# Patient Record
Sex: Male | Born: 1980 | Race: White | Hispanic: No | Marital: Married | Smoking: Current some day smoker
Health system: Southern US, Community
[De-identification: ages and names within clinical notes are randomized; demographics above are authoritative.]

## PROBLEM LIST (undated history)

## (undated) DIAGNOSIS — E669 Obesity, unspecified: Secondary | ICD-10-CM

## (undated) DIAGNOSIS — E785 Hyperlipidemia, unspecified: Secondary | ICD-10-CM

## (undated) DIAGNOSIS — R002 Palpitations: Secondary | ICD-10-CM

---

## 2006-02-01 ENCOUNTER — Emergency Department (HOSPITAL_COMMUNITY): Admission: EM | Admit: 2006-02-01 | Discharge: 2006-02-01 | Payer: Self-pay | Admitting: Emergency Medicine

## 2015-04-19 ENCOUNTER — Emergency Department
Admission: EM | Admit: 2015-04-19 | Discharge: 2015-04-19 | Disposition: A | Discharge status: Home / Self Care | Payer: BLUE CROSS/BLUE SHIELD | Attending: Emergency Medicine | Admitting: Emergency Medicine

## 2015-04-19 ENCOUNTER — Emergency Department: Payer: BLUE CROSS/BLUE SHIELD

## 2015-04-19 DIAGNOSIS — N201 Calculus of ureter: Secondary | ICD-10-CM | POA: Diagnosis not present

## 2015-04-19 DIAGNOSIS — R109 Unspecified abdominal pain: Secondary | ICD-10-CM | POA: Diagnosis present

## 2015-04-19 LAB — BASIC METABOLIC PANEL
ANION GAP: 8 (ref 5–15)
BUN: 14 mg/dL (ref 6–20)
CALCIUM: 8.9 mg/dL (ref 8.9–10.3)
CO2: 26 mmol/L (ref 22–32)
Chloride: 102 mmol/L (ref 101–111)
Creatinine, Ser: 1.08 mg/dL (ref 0.61–1.24)
GFR calc Af Amer: >60 mL/min (ref >60)
Glucose, Bld: 141 mg/dL — ABNORMAL HIGH (ref 65–99)
POTASSIUM: 4.4 mmol/L (ref 3.5–5.1)
SODIUM: 136 mmol/L (ref 135–145)

## 2015-04-19 LAB — URINALYSIS COMPLETE WITH MICROSCOPIC (ARMC ONLY)
BILIRUBIN URINE: NEGATIVE
Bacteria, UA: NONE SEEN
GLUCOSE, UA: NEGATIVE mg/dL
KETONES UR: NEGATIVE mg/dL
Leukocytes, UA: NEGATIVE
Nitrite: NEGATIVE
PH: 5 (ref 5.0–8.0)
Protein, ur: NEGATIVE mg/dL
Specific Gravity, Urine: 1.017 (ref 1.005–1.030)
Squamous Epithelial / LPF: NONE SEEN

## 2015-04-19 LAB — CBC WITH DIFFERENTIAL/PLATELET
BASOS ABS: 0.1 10*3/uL (ref 0–0.1)
Basophils Relative: 1 %
EOS ABS: 0.1 10*3/uL (ref 0–0.7)
EOS PCT: 1 %
HCT: 41.5 % (ref 40.0–52.0)
Hemoglobin: 14.1 g/dL (ref 13.0–18.0)
Lymphocytes Relative: 13 %
Lymphs Abs: 1.3 10*3/uL (ref 1.0–3.6)
MCH: 29.8 pg (ref 26.0–34.0)
MCHC: 34 g/dL (ref 32.0–36.0)
MCV: 87.4 fL (ref 80.0–100.0)
Monocytes Absolute: 0.5 10*3/uL (ref 0.2–1.0)
Monocytes Relative: 5 %
Neutro Abs: 8.2 10*3/uL — ABNORMAL HIGH (ref 1.4–6.5)
Neutrophils Relative %: 80 %
PLATELETS: 205 10*3/uL (ref 150–440)
RBC: 4.75 MIL/uL (ref 4.40–5.90)
RDW: 13.3 % (ref 11.5–14.5)
WBC: 10.1 10*3/uL (ref 3.8–10.6)

## 2015-04-19 MED ORDER — ONDANSETRON HCL 4 MG/2ML IJ SOLN
4.0000 mg | Freq: Once | INTRAMUSCULAR | Status: AC
Start: 2015-04-19 — End: 2015-04-19
  Administered 2015-04-19: 4 mg via INTRAVENOUS
  Filled 2015-04-19: qty 2

## 2015-04-19 MED ORDER — OXYCODONE-ACETAMINOPHEN 5-325 MG PO TABS: 1.0000 | ORAL_TABLET | Freq: Four times a day (QID) | ORAL | Status: DC | PRN

## 2015-04-19 MED ORDER — SODIUM CHLORIDE 0.9 % IV BOLUS (SEPSIS)
1000.0000 mL | Freq: Once | INTRAVENOUS | Status: AC
  Administered 2015-04-19: 1000 mL via INTRAVENOUS

## 2015-04-19 MED ORDER — TAMSULOSIN HCL 0.4 MG PO CAPS: 0.4000 mg | ORAL_CAPSULE | Freq: Every day | ORAL | Status: DC

## 2015-04-19 MED ORDER — MORPHINE SULFATE (PF) 4 MG/ML IV SOLN
4.0000 mg | Freq: Once | INTRAVENOUS | Status: AC
  Administered 2015-04-19: 4 mg via INTRAVENOUS
  Filled 2015-04-19: qty 1

## 2015-04-19 MED ORDER — KETOROLAC TROMETHAMINE 30 MG/ML IJ SOLN
30.0000 mg | Freq: Once | INTRAMUSCULAR | Status: AC
  Administered 2015-04-19: 30 mg via INTRAVENOUS
  Filled 2015-04-19: qty 1

## 2015-04-19 NOTE — ED Provider Notes (Signed)
[facility] Emergency Department Provider Note  Time seen: 10:49 AM  I have reviewed the triage vital signs and the nursing notes.   HISTORY  Chief Complaint Flank Pain    HPI Kevin Clark is a 35 y.o. male with no past medical history who awoke this morning at 7:30 AM with severe sharp right flank pain. States he had one kidney stone 9 years ago which this feels very similar to but feels worse. Denies any dysuria or hematuria. Does state nausea but denies vomiting or diarrhea. Denies fever. Describes his pain as sharp/stabbing, 10/10 and located in the right back to right flank.     No past medical history on file.  There are no active problems to display for this patient.   No past surgical history on file.  No current outpatient prescriptions on file.  Allergies Review of patient's allergies indicates no known allergies.  No family history on file.  Social History Social History  Substance Use Topics  . Smoking status: Not on file  . Smokeless tobacco: Not on file  . Alcohol Use: Not on file    Review of Systems Constitutional: Negative for fever. Cardiovascular: Negative for chest pain. Respiratory: Negative for shortness of breath. Gastrointestinal: Positive right flank pain. Positive nausea. Negative vomiting or diarrhea. Genitourinary: Negative for dysuria. Negative hematuria. Neurological: Negative for headache 10-point ROS otherwise negative.  ____________________________________________   PHYSICAL EXAM:  VITAL SIGNS: ED Triage Vitals  Enc Vitals Group     BP 04/19/15 1009 147/90 mmHg     Pulse Rate 04/19/15 1009 63     Resp 04/19/15 1009 18     Temp 04/19/15 1009 97.9 F (36.6 C)     Temp Source 04/19/15 1009 Oral     SpO2 04/19/15 1009 100 %     Weight 04/19/15 1009 220 lb (99.791 kg)     Height 04/19/15 1009  (1.651 m)     Head Cir --      Peak Flow --      Pain Score 04/19/15 1011 6     Pain Loc --    Pain Edu? --      Excl. in GC? --     Constitutional: Alert and oriented. Well appearing and in no distress. Eyes: Normal exam ENT   Head: Normocephalic and atraumatic   Mouth/Throat: Mucous membranes are moist. Cardiovascular: Normal rate, regular rhythm. No murmur Respiratory: Normal respiratory effort without tachypnea nor retractions. Breath sounds are clear  Gastrointestinal: Soft and nontender. No distention.  No CVA tenderness. Musculoskeletal: Nontender with normal range of motion in all extremities.  Neurologic:  Normal speech and language. No gross focal neurologic deficits Skin:  Skin is warm, dry and intact.  Psychiatric: Mood and affect are normal. Speech and behavior are normal.   ____________________________________________   INITIAL IMPRESSION / ASSESSMENT AND PLAN / ED COURSE  Pertinent labs & imaging results that were available during my care of the patient were reviewed by me and considered in my medical decision making (see chart for details).  Patient presents the emergency department with right flank pain beginning approximately 3 hours ago. Describes a sharp/stabbing similar to his previous kidney stone. Prior kidney stone 9 years ago passed on its own without intervention. We will treat the patient's pain and nausea, IV hydrate, and obtain a CT scan.  CT consistent with 3 mm right ureteral stone. We'll place the patient on Flomax, Percocet, have him follow up with Dr. Apolinar Junes. Patient  states his pain is a 0/10 currently.  ____________________________________________   FINAL CLINICAL IMPRESSION(S) / ED DIAGNOSES  Right ureterolithiasis   Kevin Antis, MD 04/19/15 1228

## 2015-04-19 NOTE — ED Notes (Signed)
Pt reports right sided flank pain starting this am. Pt has hx of renal stone but reports last one 9 years ago

## 2015-04-19 NOTE — Discharge Instructions (Signed)
Kidney Stones °Kidney stones (urolithiasis) are deposits that form inside your kidneys. The intense pain is caused by the stone moving through the urinary tract. When the stone moves, the ureter goes into spasm around the stone. The stone is usually passed in the urine.  °CAUSES  °· A disorder that makes certain neck glands produce too much parathyroid hormone (primary hyperparathyroidism). °· A buildup of uric acid crystals, similar to gout in your joints. °· Narrowing (stricture) of the ureter. °· A kidney obstruction present at birth (congenital obstruction). °· Previous surgery on the kidney or ureters. °· Numerous kidney infections. °SYMPTOMS  °· Feeling sick to your stomach (nauseous). °· Throwing up (vomiting). °· Blood in the urine (hematuria). °· Pain that usually spreads (radiates) to the groin. °· Frequency or urgency of urination. °DIAGNOSIS  °· Taking a history and physical exam. °· Blood or urine tests. °· CT scan. °· Occasionally, an examination of the inside of the urinary bladder (cystoscopy) is performed. °TREATMENT  °· Observation. °· Increasing your fluid intake. °· Extracorporeal shock wave lithotripsy--This is a noninvasive procedure that uses shock waves to break up kidney stones. °· Surgery may be needed if you have severe pain or persistent obstruction. There are various surgical procedures. Most of the procedures are performed with the use of small instruments. Only small incisions are needed to accommodate these instruments, so recovery time is minimized. °The size, location, and chemical composition are all important variables that will determine the proper choice of action for you. Talk to your health care provider to better understand your situation so that you will minimize the risk of injury to yourself and your kidney.  °HOME CARE INSTRUCTIONS  °· Drink enough water and fluids to keep your urine clear or pale yellow. This will help you to pass the stone or stone fragments. °· Strain  all urine through the provided strainer. Keep all particulate matter and stones for your health care provider to see. The stone causing the pain may be as small as a grain of salt. It is very important to use the strainer each and every time you pass your urine. The collection of your stone will allow your health care provider to analyze it and verify that a stone has actually passed. The stone analysis will often identify what you can do to reduce the incidence of recurrences. °· Only take over-the-counter or prescription medicines for pain, discomfort, or fever as directed by your health care provider. °· Keep all follow-up visits as told by your health care provider. This is important. °· Get follow-up X-rays if required. The absence of pain does not always mean that the stone has passed. It may have only stopped moving. If the urine remains completely obstructed, it can cause loss of kidney function or even complete destruction of the kidney. It is your responsibility to make sure X-rays and follow-ups are completed. Ultrasounds of the kidney can show blockages and the status of the kidney. Ultrasounds are not associated with any radiation and can be performed easily in a matter of minutes. °· Make changes to your daily diet as told by your health care provider. You may be told to: °¨ Limit the amount of salt that you eat. °¨ Eat 5 or more servings of fruits and vegetables each day. °¨ Limit the amount of meat, poultry, fish, and eggs that you eat. °· Collect a 24-hour urine sample as told by your health care provider. You may need to collect another urine sample every 6-12   months. °SEEK MEDICAL CARE IF: °· You experience pain that is progressive and unresponsive to any pain medicine you have been prescribed. °SEEK IMMEDIATE MEDICAL CARE IF:  °· Pain cannot be controlled with the prescribed medicine. °· You have a fever or shaking chills. °· The severity or intensity of pain increases over 18 hours and is not  relieved by pain medicine. °· You develop a new onset of abdominal pain. °· You feel faint or pass out. °· You are unable to urinate. °  °This information is not intended to replace advice given to you by your health care provider. Make sure you discuss any questions you have with your health care provider. °  °Document Released: 02/27/2005 Document Revised: 11/18/2014 Document Reviewed: 07/31/2012 °Elsevier Interactive Patient Education ©2016 [employer] ° °

## 2018-04-24 DIAGNOSIS — H6012 Cellulitis of left external ear: Secondary | ICD-10-CM | POA: Diagnosis not present

## 2018-08-26 DIAGNOSIS — R0609 Other forms of dyspnea: Secondary | ICD-10-CM | POA: Diagnosis not present

## 2018-08-26 DIAGNOSIS — Z0000 Encounter for general adult medical examination without abnormal findings: Secondary | ICD-10-CM | POA: Diagnosis not present

## 2018-08-26 DIAGNOSIS — E782 Mixed hyperlipidemia: Secondary | ICD-10-CM | POA: Diagnosis not present

## 2018-08-26 DIAGNOSIS — L03221 Cellulitis of neck: Secondary | ICD-10-CM | POA: Diagnosis not present

## 2018-08-26 DIAGNOSIS — Z6841 Body Mass Index (BMI) 40.0 and over, adult: Secondary | ICD-10-CM | POA: Diagnosis not present

## 2019-07-03 ENCOUNTER — Ambulatory Visit: Payer: Self-pay | Attending: Internal Medicine

## 2019-07-03 DIAGNOSIS — Z23 Encounter for immunization: Secondary | ICD-10-CM

## 2019-07-03 NOTE — Progress Notes (Signed)
   Covid-19 Vaccination Clinic  Name:  Kevin Clark    MRN: [id number] DOB: 1981-01-27  07/03/2019  [name] was observed post Covid-19 immunization for 15 minutes without incident. He was provided with Vaccine Information Sheet and instruction to access the V-Safe system.   [name] was instructed to call 911 with any severe reactions post vaccine: Marland Kitchen Difficulty breathing  . Swelling of face and throat  . A fast heartbeat  . A bad rash all over body  . Dizziness and weakness   Immunizations Administered    Name Date Dose VIS Date Route   Pfizer COVID-19 Vaccine 07/03/2019  8:30 AM 0.3 mL 05/07/2018 Intramuscular   Manufacturer: [employer], [employer]   Lot: [id number]   NDC: [address]-8

## 2019-07-29 ENCOUNTER — Emergency Department
Admission: EM | Admit: 2019-07-29 | Discharge: 2019-07-29 | Disposition: A | Discharge status: Home / Self Care | Payer: 59 | Attending: Emergency Medicine | Admitting: Emergency Medicine

## 2019-07-29 ENCOUNTER — Ambulatory Visit: Payer: Self-pay

## 2019-07-29 ENCOUNTER — Encounter: Payer: Self-pay | Admitting: Emergency Medicine

## 2019-07-29 ENCOUNTER — Other Ambulatory Visit: Payer: Self-pay

## 2019-07-29 ENCOUNTER — Emergency Department: Payer: 59

## 2019-07-29 DIAGNOSIS — N132 Hydronephrosis with renal and ureteral calculous obstruction: Secondary | ICD-10-CM | POA: Diagnosis not present

## 2019-07-29 DIAGNOSIS — N23 Unspecified renal colic: Secondary | ICD-10-CM

## 2019-07-29 DIAGNOSIS — R1032 Left lower quadrant pain: Secondary | ICD-10-CM | POA: Insufficient documentation

## 2019-07-29 DIAGNOSIS — N201 Calculus of ureter: Secondary | ICD-10-CM | POA: Insufficient documentation

## 2019-07-29 DIAGNOSIS — R109 Unspecified abdominal pain: Secondary | ICD-10-CM

## 2019-07-29 LAB — CBC WITH DIFFERENTIAL/PLATELET
Abs Immature Granulocytes: 0.03 10*3/uL (ref 0.00–0.07)
Basophils Absolute: 0.1 10*3/uL (ref 0.0–0.1)
Basophils Relative: 1 %
Eosinophils Absolute: 0.3 10*3/uL (ref 0.0–0.5)
Eosinophils Relative: 3 %
HCT: 40.1 % (ref 39.0–52.0)
Hemoglobin: 14 g/dL (ref 13.0–17.0)
Immature Granulocytes: 0 %
Lymphocytes Relative: 48 %
Lymphs Abs: 5.3 10*3/uL — ABNORMAL HIGH (ref 0.7–4.0)
MCH: 30.4 pg (ref 26.0–34.0)
MCHC: 34.9 g/dL (ref 30.0–36.0)
MCV: 87 fL (ref 80.0–100.0)
Monocytes Absolute: 1.3 10*3/uL — ABNORMAL HIGH (ref 0.1–1.0)
Monocytes Relative: 11 %
Neutro Abs: 4.2 10*3/uL (ref 1.7–7.7)
Neutrophils Relative %: 37 %
Platelets: 234 10*3/uL (ref 150–400)
RBC: 4.61 MIL/uL (ref 4.22–5.81)
RDW: 12.8 % (ref 11.5–15.5)
WBC: 11.2 10*3/uL — ABNORMAL HIGH (ref 4.0–10.5)
nRBC: 0 % (ref 0.0–0.2)

## 2019-07-29 LAB — URINALYSIS, COMPLETE (UACMP) WITH MICROSCOPIC
Bacteria, UA: NONE SEEN
Bilirubin Urine: NEGATIVE
Glucose, UA: NEGATIVE mg/dL
Ketones, ur: NEGATIVE mg/dL
Leukocytes,Ua: NEGATIVE
Nitrite: NEGATIVE
Protein, ur: 30 mg/dL — AB
RBC / HPF: >50 RBC/hpf — ABNORMAL HIGH (ref 0–5)
Specific Gravity, Urine: 1.023 (ref 1.005–1.030)
pH: 5 (ref 5.0–8.0)

## 2019-07-29 LAB — BASIC METABOLIC PANEL
Anion gap: 7 (ref 5–15)
BUN: 20 mg/dL (ref 6–20)
CO2: 27 mmol/L (ref 22–32)
Calcium: 9.1 mg/dL (ref 8.9–10.3)
Chloride: 105 mmol/L (ref 98–111)
Creatinine, Ser: 1.28 mg/dL — ABNORMAL HIGH (ref 0.61–1.24)
GFR calc Af Amer: >60 mL/min (ref >60)
GFR calc non Af Amer: >60 mL/min (ref >60)
Glucose, Bld: 138 mg/dL — ABNORMAL HIGH (ref 70–99)
Potassium: 3.7 mmol/L (ref 3.5–5.1)
Sodium: 139 mmol/L (ref 135–145)

## 2019-07-29 MED ORDER — SODIUM CHLORIDE 0.9 % IV BOLUS
1000.0000 mL | Freq: Once | INTRAVENOUS | Status: AC
  Administered 2019-07-29: 1000 mL via INTRAVENOUS

## 2019-07-29 MED ORDER — HYDROMORPHONE HCL 1 MG/ML IJ SOLN
1.0000 mg | Freq: Once | INTRAMUSCULAR | Status: AC
  Administered 2019-07-29: 1 mg via INTRAVENOUS
  Filled 2019-07-29: qty 1

## 2019-07-29 MED ORDER — ONDANSETRON HCL 4 MG/2ML IJ SOLN
4.0000 mg | Freq: Once | INTRAMUSCULAR | Status: AC
  Administered 2019-07-29: 4 mg via INTRAVENOUS
  Filled 2019-07-29: qty 2

## 2019-07-29 MED ORDER — OXYCODONE-ACETAMINOPHEN 5-325 MG PO TABS: 1.0000 | ORAL_TABLET | ORAL | 0 refills | Status: DC | PRN

## 2019-07-29 MED ORDER — OXYCODONE-ACETAMINOPHEN 5-325 MG PO TABS
1.0000 | ORAL_TABLET | Freq: Once | ORAL | Status: AC
  Administered 2019-07-29: 1 via ORAL
  Filled 2019-07-29: qty 1

## 2019-07-29 MED ORDER — TAMSULOSIN HCL 0.4 MG PO CAPS: 0.4000 mg | ORAL_CAPSULE | Freq: Every day | ORAL | 0 refills | Status: DC

## 2019-07-29 MED ORDER — IBUPROFEN 800 MG PO TABS: 800.0000 mg | ORAL_TABLET | Freq: Three times a day (TID) | ORAL | 0 refills | Status: DC | PRN

## 2019-07-29 MED ORDER — ONDANSETRON 4 MG PO TBDP: 4.0000 mg | ORAL_TABLET | Freq: Three times a day (TID) | ORAL | 0 refills | Status: DC | PRN

## 2019-07-29 MED ORDER — HYDROMORPHONE HCL 1 MG/ML IJ SOLN
0.5000 mg | Freq: Once | INTRAMUSCULAR | Status: AC
  Administered 2019-07-29: 0.5 mg via INTRAVENOUS
  Filled 2019-07-29: qty 1

## 2019-07-29 MED ORDER — TAMSULOSIN HCL 0.4 MG PO CAPS
0.4000 mg | ORAL_CAPSULE | Freq: Once | ORAL | Status: AC
  Administered 2019-07-29: 0.4 mg via ORAL
  Filled 2019-07-29: qty 1

## 2019-07-29 MED ORDER — KETOROLAC TROMETHAMINE 30 MG/ML IJ SOLN
15.0000 mg | Freq: Once | INTRAMUSCULAR | Status: AC
  Administered 2019-07-29: 15 mg via INTRAVENOUS
  Filled 2019-07-29: qty 1

## 2019-07-29 NOTE — ED Notes (Signed)
Pt reports history of kidney stones. Pt states LLQ abdominal pain that woke him up. Pot states some pain radiating to the groin. Pt resting in room. Visitor at bedside. Pt placed on bp and pulse ox monitoring.

## 2019-07-29 NOTE — Discharge Instructions (Signed)
1. Take pain & nausea medicines as needed (Motrin #15, Percocet/Zofran #30). Make sure to take a stool softener while taking narcotic pain medicines. 2. Take Flomax 0.4mg  daily x 14 days. 3. Drink plenty of bottled or filtered water daily. 4. Return to the ER for worsening symptoms, persistent vomiting, fever, difficulty breathing or other concerns.

## 2019-07-29 NOTE — ED Provider Notes (Signed)
[facility] Emergency Department Provider Note   ____________________________________________   First MD Initiated Contact with Patient 07/29/19 0119     (approximate)  I have reviewed the triage vital signs and the nursing notes.   HISTORY  Chief Complaint Flank Pain    HPI Kevin Clark is a 39 y.o. male who presents to the ED from home with a chief complaint of left flank pain.  Patient awoke from sleep approximately 45 minutes ago with left flank pain radiating into his groin associated with nausea and vomiting.  History of kidney stones which he has passed naturally.  Denies associated fever, cough, chest pain, shortness of breath, hematuria, testicular pain or swelling.       Past medical history Kidney stone  There are no problems to display for this patient.   History reviewed. No pertinent surgical history.  Prior to Admission medications   Medication Sig Start Date End Date Taking? Authorizing Provider  ibuprofen (ADVIL) 800 MG tablet Take 1 tablet (800 mg total) by mouth every 8 (eight) hours as needed for moderate pain. 07/29/19   Irean Hong, MD  ondansetron (ZOFRAN ODT) 4 MG disintegrating tablet Take 1 tablet (4 mg total) by mouth every 8 (eight) hours as needed for nausea or vomiting. 07/29/19   Irean Hong, MD  oxyCODONE-acetaminophen (PERCOCET/ROXICET) 5-325 MG tablet Take 1 tablet by mouth every 4 (four) hours as needed for severe pain. 07/29/19   Irean Hong, MD  tamsulosin (FLOMAX) 0.4 MG CAPS capsule Take 1 capsule (0.4 mg total) by mouth daily. 07/29/19   Irean Hong, MD    Allergies Patient has no known allergies.  No family history on file.  Social History Social History   Tobacco Use  . Smoking status: Not on file  Substance Use Topics  . Alcohol use: Not on file  . Drug use: Not on file    Review of Systems  Constitutional: No fever/chills Eyes: No visual changes. ENT: No sore throat. Cardiovascular:  Denies chest pain. Respiratory: Denies shortness of breath. Gastrointestinal: Positive for left flank and groin pain.  No abdominal pain.  Positive for vomiting.  No diarrhea.  No constipation. Genitourinary: Negative for dysuria. Musculoskeletal: Negative for back pain. Skin: Negative for rash. Neurological: Negative for headaches, focal weakness or numbness.   ____________________________________________   PHYSICAL EXAM:  VITAL SIGNS: ED Triage Vitals  Enc Vitals Group     BP 07/29/19 0113 (!) 142/104     Pulse Rate 07/29/19 0113 60     Resp --      Temp 07/29/19 0113 97.9 F (36.6 C)     Temp Source 07/29/19 0113 Oral     SpO2 07/29/19 0113 100 %     Weight 07/29/19 0112 240 lb (108.9 kg)     Height 07/29/19 0112 5\' 5"  (1.651 m)     Head Circumference --      Peak Flow --      Pain Score 07/29/19 0112 9     Pain Loc --      Pain Edu? --      Excl. in GC? --     Constitutional: Alert and oriented. Well appearing and in mild to moderate acute distress. Eyes: Conjunctivae are normal. PERRL. EOMI. Head: Atraumatic. Nose: No congestion/rhinnorhea. Mouth/Throat: Mucous membranes are moist.   Neck: No stridor.   Cardiovascular: Normal rate, regular rhythm. Grossly normal heart sounds.  Good peripheral circulation. Respiratory: Normal respiratory effort.  No  retractions. Lungs CTAB. Gastrointestinal: Soft and mildly tender to palpation left lower quadrant without rebound or guarding. No distention. No abdominal bruits. No CVA tenderness. Musculoskeletal: No lower extremity tenderness nor edema.  No joint effusions. Neurologic:  Normal speech and language. No gross focal neurologic deficits are appreciated.  Skin:  Skin is warm, dry and intact. No rash noted.  No vesicles. Psychiatric: Mood and affect are normal. Speech and behavior are normal.  ____________________________________________   LABS (all labs ordered are listed, but only abnormal results are  displayed)  Labs Reviewed  CBC WITH DIFFERENTIAL/PLATELET - Abnormal; Notable for the following components:      Result Value   WBC 11.2 (*)    Lymphs Abs 5.3 (*)    Monocytes Absolute 1.3 (*)    All other components within normal limits  BASIC METABOLIC PANEL - Abnormal; Notable for the following components:   Glucose, Bld 138 (*)    Creatinine, Ser 1.28 (*)    All other components within normal limits  URINALYSIS, COMPLETE (UACMP) WITH MICROSCOPIC - Abnormal; Notable for the following components:   Color, Urine YELLOW (*)    APPearance CLOUDY (*)    Hgb urine dipstick LARGE (*)    Protein, ur 30 (*)    RBC / HPF >50 (*)    All other components within normal limits   ____________________________________________  EKG  None ____________________________________________  RADIOLOGY  ED MD interpretation: 3 mm mid left ureteral stone with mild hydronephrosis  Official radiology report(s): CT Renal Stone Study  Result Date: 07/29/2019 CLINICAL DATA:  Left lower quadrant abdominal pain with a history of kidney stones. EXAM: CT ABDOMEN AND PELVIS WITHOUT CONTRAST TECHNIQUE: Multidetector CT imaging of the abdomen and pelvis was performed following the standard protocol without IV contrast. COMPARISON:  04/19/2015 FINDINGS: Lower chest: The lung bases are clear. The heart size is normal. Hepatobiliary: There is decreased hepatic attenuation suggestive of hepatic steatosis. Normal gallbladder.There is no biliary ductal dilation. Pancreas: Normal contours without ductal dilatation. No peripancreatic fluid collection. Spleen: Unremarkable. Adrenals/Urinary Tract: --Adrenal glands: Unremarkable. --Right kidney/ureter: There are multiple punctate nonobstructing stones involving the right kidney. --Left kidney/ureter: There is mild left-sided hydronephrosis secondary to an obstructing 3 mm stone in the mid left ureter. --Urinary bladder: Unremarkable. Stomach/Bowel: --Stomach/Duodenum: No hiatal  hernia or other gastric abnormality. Normal duodenal course and caliber. --Small bowel: Unremarkable. --Colon: Unremarkable. --Appendix: Normal. Vascular/Lymphatic: Normal course and caliber of the major abdominal vessels. --No retroperitoneal lymphadenopathy. --No mesenteric lymphadenopathy. --No pelvic or inguinal lymphadenopathy. Reproductive: Unremarkable Other: No ascites or free air. The abdominal wall is normal. Musculoskeletal. No acute displaced fractures. IMPRESSION: 1. Mild left-sided hydronephrosis secondary to partially obstructing 3 mm stone in the mid left ureter. 2. Multiple punctate nonobstructing stones involving the right kidney. 3. Hepatic steatosis. Electronically Signed   By: Constance Holster M.D.   On: 07/29/2019 02:11    ____________________________________________   PROCEDURES  Procedure(s) performed (including Critical Care):  Procedures   ____________________________________________   INITIAL IMPRESSION / ASSESSMENT AND PLAN / ED COURSE  As part of my medical decision making, I reviewed the following data within the [place] notes reviewed and incorporated, Labs reviewed, Old chart reviewed, Radiograph reviewed, Notes from prior ED visits and [place] Controlled Substance Database     Jaymon Dudek was evaluated in Emergency Department on 07/29/2019 for the symptoms described in the history of present illness. He was evaluated in the context of the global COVID-19 pandemic, which necessitated  consideration that the patient might be at risk for infection with the SARS-CoV-2 virus that causes COVID-19. Institutional protocols and algorithms that pertain to the evaluation of patients at risk for COVID-19 are in a state of rapid change based on information released by regulatory bodies including the CDC and federal and state organizations. These policies and algorithms were followed during the patient's care in the ED.    39 year old male  presenting with sudden onset left flank pain radiating to groin. Differential diagnosis includes, but is not limited to, acute appendicitis, renal colic, testicular torsion, urinary tract infection/pyelonephritis, prostatitis,  epididymitis, diverticulitis, small bowel obstruction or ileus, colitis, abdominal aortic aneurysm, gastroenteritis, hernia, etc.  Will obtain basic lab work, UA, CT renal colic study.  Initiate IV fluid resuscitation, IV Dilaudid for pain paired with IV Zofran for nausea.  Will reassess.   Clinical Course as of Jul 29 527  Tue Jul 29, 2019  0300 Pain-free.  Updated patient and family member of laboratory and imaging results.  Awaiting UA result.   [JS]  0400 UA negative for infection.  Will discharge home on Flomax, Percocet, Zofran and patient will follow up with urology as needed.  Strict return precautions given.  Patient and spouse verbalized understanding and agree with plan of care.   [JS]  [id number] Addendum on chart review: patient received additional analgesia, felt better and was discharged in good and stable condition.   [JS]    Clinical Course User Index [JS] Irean Hong, MD     ____________________________________________   FINAL CLINICAL IMPRESSION(S) / ED DIAGNOSES  Final diagnoses:  Left flank pain  Ureteral colic     ED Discharge Orders         Ordered    tamsulosin (FLOMAX) 0.4 MG CAPS capsule  Daily     07/29/19 0304    ibuprofen (ADVIL) 800 MG tablet  Every 8 hours PRN     07/29/19 0304    oxyCODONE-acetaminophen (PERCOCET/ROXICET) 5-325 MG tablet  Every 4 hours PRN     07/29/19 0304    ondansetron (ZOFRAN ODT) 4 MG disintegrating tablet  Every 8 hours PRN     07/29/19 0304           Note:  This document was prepared using Dragon voice recognition software and may include unintentional dictation errors.   Irean Hong, MD 07/29/19 [phone]

## 2019-07-29 NOTE — ED Notes (Signed)
Pt states wife is driving him home.

## 2019-07-29 NOTE — ED Triage Notes (Signed)
Pt ambulatory to triage, appears uncomfortable, grimacing; Awoke PTA with left flank pain radiating down into groin; st hx kidney stones

## 2019-08-05 ENCOUNTER — Ambulatory Visit (INDEPENDENT_AMBULATORY_CARE_PROVIDER_SITE_OTHER): Payer: 59 | Admitting: Urology

## 2019-08-05 ENCOUNTER — Encounter: Payer: Self-pay | Admitting: Urology

## 2019-08-05 ENCOUNTER — Ambulatory Visit
Admission: RE | Admit: 2019-08-05 | Discharge: 2019-08-05 | Disposition: A | Discharge status: Home / Self Care | Payer: 59 | Source: Ambulatory Visit | Attending: Urology | Admitting: Urology

## 2019-08-05 ENCOUNTER — Other Ambulatory Visit: Payer: Self-pay

## 2019-08-05 VITALS — BP 146/96 | HR 65 | Ht 65.0 in | Wt 235.0 lb

## 2019-08-05 DIAGNOSIS — N200 Calculus of kidney: Secondary | ICD-10-CM | POA: Insufficient documentation

## 2019-08-05 MED ORDER — OXYCODONE-ACETAMINOPHEN 5-325 MG PO TABS: 1.0000 | ORAL_TABLET | ORAL | 0 refills | Status: DC | PRN

## 2019-08-05 NOTE — Progress Notes (Signed)
08/05/2019 12:37 PM   Kevin Clark [birth year] [id number]  Referring provider: Claiborne Billings, MD No address on file  Chief Complaint  Patient presents with  . Nephrolithiasis    HPI: Kevin Clark is a 39 y.o. male who presents for recent [facility] ED follow-up  -Seen 07/29/2019 with acute onset left flank pain radiating to left groin region -Pain rated severe without identifiable precipitating, aggravating or alleviating factors -Positive nausea/vomiting -No fever, chills -CT showed 3 mm left proximal ureteral calculus with mild hydronephrosis -Discharged on oral analgesics and tamsulosin -Has not taken tamsulosin -Had recurrent pain yesterday which has been persistent but controlled with oxycodone -2 previous stone episodes in 2007 and 2017   PMH: No past medical history on file.  Surgical History: No past surgical history on file.  Home Medications:  Allergies as of 08/05/2019   No Known Allergies     Medication List       Accurate as of Aug 05, 2019 12:37 PM. If you have any questions, ask your nurse or doctor.        ibuprofen 800 MG tablet Commonly known as: ADVIL Take 1 tablet (800 mg total) by mouth every 8 (eight) hours as needed for moderate pain.   ondansetron 4 MG disintegrating tablet Commonly known as: Zofran ODT Take 1 tablet (4 mg total) by mouth every 8 (eight) hours as needed for nausea or vomiting.   oxyCODONE-acetaminophen 5-325 MG tablet Commonly known as: PERCOCET/ROXICET Take 1 tablet by mouth every 4 (four) hours as needed for severe pain.   tamsulosin 0.4 MG Caps capsule Commonly known as: Flomax Take 1 capsule (0.4 mg total) by mouth daily.       Allergies: No Known Allergies  Family History: No family history on file.  Social History:  has no history on file for tobacco, alcohol, and drug.   Physical Exam: BP (!) 146/96   Pulse 65   Ht 5\' 5"  (1.651 m)   Wt 235 lb (106.6 kg)   BMI 39.11 kg/m   Constitutional:  Alert  and oriented, No acute distress. HEENT: [place] AT, moist mucus membranes.  Trachea midline, no masses. Cardiovascular: No clubbing, cyanosis, or edema.  RRR Respiratory: Normal respiratory effort, no increased work of breathing.  Clear Lymph: No cervical or inguinal lymphadenopathy. Skin: No rashes, bruises or suspicious lesions. Neurologic: Grossly intact, no focal deficits, moving all 4 extremities. Psychiatric: Normal mood and affect.   Pertinent Imaging: CT personally reviewed  Results for orders placed during the hospital encounter of 07/29/19  CT Renal Stone Study   Narrative CLINICAL DATA:  Left lower quadrant abdominal pain with a history of kidney stones.  EXAM: CT ABDOMEN AND PELVIS WITHOUT CONTRAST  TECHNIQUE: Multidetector CT imaging of the abdomen and pelvis was performed following the standard protocol without IV contrast.  COMPARISON:  04/19/2015  FINDINGS: Lower chest: The lung bases are clear. The heart size is normal.  Hepatobiliary: There is decreased hepatic attenuation suggestive of hepatic steatosis. Normal gallbladder.There is no biliary ductal dilation.  Pancreas: Normal contours without ductal dilatation. No peripancreatic fluid collection.  Spleen: Unremarkable.  Adrenals/Urinary Tract:  --Adrenal glands: Unremarkable.  --Right kidney/ureter: There are multiple punctate nonobstructing stones involving the right kidney.  --Left kidney/ureter: There is mild left-sided hydronephrosis secondary to an obstructing 3 mm stone in the mid left ureter.  --Urinary bladder: Unremarkable.  Stomach/Bowel:  --Stomach/Duodenum: No hiatal hernia or other gastric abnormality. Normal duodenal course and caliber.  --Small bowel: Unremarkable.  --Colon:  Unremarkable.  --Appendix: Normal.  Vascular/Lymphatic: Normal course and caliber of the major abdominal vessels.  --No retroperitoneal lymphadenopathy.  --No mesenteric lymphadenopathy.  --No  pelvic or inguinal lymphadenopathy.  Reproductive: Unremarkable  Other: No ascites or free air. The abdominal wall is normal.  Musculoskeletal. No acute displaced fractures.  IMPRESSION: 1. Mild left-sided hydronephrosis secondary to partially obstructing 3 mm stone in the mid left ureter. 2. Multiple punctate nonobstructing stones involving the right kidney. 3. Hepatic steatosis.   Electronically Signed   By: Katherine Mantle M.D.   On: 07/29/2019 02:11     Assessment & Plan:    1.  Left proximal ureteral calculus -3 mm calculus with mild hydronephrosis -Informed there is an excellent chance this stone will pass  -Ureteroscopic stone removal and shockwave lithotripsy both discussed including pros and cons of each treatment -Recommend he start tamsulosin -KUB ordered to see if calculus visualized and will be notified with results -Percocet refilled  2. Nephrolithiasis -CT showed nonobstructing right renal calculi -Prior history stone disease -Recommend metabolic evaluation once current episode resolves   Riki Altes, MD  [facility] [address] [place], [place] [address] [phone]

## 2019-08-08 ENCOUNTER — Telehealth: Payer: Self-pay | Admitting: Family Medicine

## 2019-08-08 NOTE — Telephone Encounter (Signed)
-----   Message from Riki Altes, MD sent at 08/07/2019  8:13 PM EDT ----- The stone appears to have migrated to the lower ureter.  If symptoms controlled on pain medication would recommend a continued trial of passage.  If he does not pass the stone we could schedule lithotripsy next week

## 2019-08-08 NOTE — Telephone Encounter (Signed)
-----   Message from Scott C Stoioff, MD sent at 08/07/2019  8:13 PM EDT ----- The stone appears to have migrated to the lower ureter.  If symptoms controlled on pain medication would recommend a continued trial of passage.  If he does not pass the stone we could schedule lithotripsy next week 

## 2019-08-08 NOTE — Telephone Encounter (Signed)
Patient notified and voiced understanding. He will continue to try to pass the stone. He will call next week if he can't pass it.

## 2019-09-01 DIAGNOSIS — Z0000 Encounter for general adult medical examination without abnormal findings: Secondary | ICD-10-CM | POA: Diagnosis not present

## 2019-09-01 DIAGNOSIS — E782 Mixed hyperlipidemia: Secondary | ICD-10-CM | POA: Diagnosis not present

## 2019-09-01 DIAGNOSIS — Z6838 Body mass index (BMI) 38.0-38.9, adult: Secondary | ICD-10-CM | POA: Diagnosis not present

## 2019-10-06 DIAGNOSIS — Z23 Encounter for immunization: Secondary | ICD-10-CM | POA: Diagnosis not present

## 2019-10-06 DIAGNOSIS — Z7189 Other specified counseling: Secondary | ICD-10-CM | POA: Diagnosis not present

## 2019-12-22 DIAGNOSIS — H5213 Myopia, bilateral: Secondary | ICD-10-CM | POA: Diagnosis not present

## 2020-03-09 DIAGNOSIS — Z20822 Contact with and (suspected) exposure to covid-19: Secondary | ICD-10-CM | POA: Diagnosis not present

## 2020-04-21 ENCOUNTER — Other Ambulatory Visit (HOSPITAL_COMMUNITY): Payer: Self-pay | Admitting: Family Medicine

## 2020-05-17 DIAGNOSIS — H16041 Marginal corneal ulcer, right eye: Secondary | ICD-10-CM | POA: Diagnosis not present

## 2020-05-18 ENCOUNTER — Other Ambulatory Visit (HOSPITAL_COMMUNITY): Payer: Self-pay | Admitting: Ophthalmology

## 2020-05-18 DIAGNOSIS — H16041 Marginal corneal ulcer, right eye: Secondary | ICD-10-CM | POA: Diagnosis not present

## 2020-05-20 DIAGNOSIS — H16041 Marginal corneal ulcer, right eye: Secondary | ICD-10-CM | POA: Diagnosis not present

## 2020-05-24 DIAGNOSIS — H16041 Marginal corneal ulcer, right eye: Secondary | ICD-10-CM | POA: Diagnosis not present

## 2020-05-31 DIAGNOSIS — H16041 Marginal corneal ulcer, right eye: Secondary | ICD-10-CM | POA: Diagnosis not present

## 2020-09-16 ENCOUNTER — Other Ambulatory Visit: Payer: Self-pay

## 2020-09-16 DIAGNOSIS — E782 Mixed hyperlipidemia: Secondary | ICD-10-CM | POA: Diagnosis not present

## 2020-09-16 DIAGNOSIS — Z0000 Encounter for general adult medical examination without abnormal findings: Secondary | ICD-10-CM | POA: Diagnosis not present

## 2020-09-16 DIAGNOSIS — Z6839 Body mass index (BMI) 39.0-39.9, adult: Secondary | ICD-10-CM | POA: Diagnosis not present

## 2020-09-16 DIAGNOSIS — L03221 Cellulitis of neck: Secondary | ICD-10-CM | POA: Diagnosis not present

## 2020-09-16 MED ORDER — DOXYCYCLINE HYCLATE 100 MG PO CAPS
ORAL_CAPSULE | ORAL | 3 refills | Status: DC
  Filled 2020-09-16: qty 90, 90d supply, fill #0

## 2020-09-16 MED ORDER — PHENTERMINE HCL 37.5 MG PO CAPS
ORAL_CAPSULE | ORAL | 1 refills | Status: DC
  Filled 2020-09-16: qty 30, 30d supply, fill #0

## 2020-09-16 MED ORDER — SIMVASTATIN 40 MG PO TABS
ORAL_TABLET | ORAL | 3 refills | Status: DC
  Filled 2020-09-16: qty 90, 90d supply, fill #0
  Filled 2021-05-02: qty 90, 90d supply, fill #1

## 2020-10-14 ENCOUNTER — Other Ambulatory Visit: Payer: Self-pay

## 2020-10-14 DIAGNOSIS — Z6838 Body mass index (BMI) 38.0-38.9, adult: Secondary | ICD-10-CM | POA: Diagnosis not present

## 2020-10-14 MED ORDER — PHENTERMINE HCL 37.5 MG PO CAPS
ORAL_CAPSULE | ORAL | 0 refills | Status: DC
  Filled 2020-10-14: qty 90, 90d supply, fill #0

## 2021-01-14 DIAGNOSIS — Z6837 Body mass index (BMI) 37.0-37.9, adult: Secondary | ICD-10-CM | POA: Diagnosis not present

## 2021-01-14 DIAGNOSIS — Z23 Encounter for immunization: Secondary | ICD-10-CM | POA: Diagnosis not present

## 2021-01-17 ENCOUNTER — Other Ambulatory Visit: Payer: Self-pay

## 2021-01-17 MED ORDER — IBUPROFEN 800 MG PO TABS
ORAL_TABLET | ORAL | 11 refills | Status: DC
  Filled 2021-01-17: qty 100, 33d supply, fill #0

## 2021-01-20 ENCOUNTER — Other Ambulatory Visit: Payer: Self-pay

## 2021-01-20 MED ORDER — PHENTERMINE HCL 37.5 MG PO CAPS
ORAL_CAPSULE | ORAL | 0 refills | Status: DC
  Filled 2021-01-20: qty 90, 90d supply, fill #0

## 2021-05-02 ENCOUNTER — Other Ambulatory Visit: Payer: Self-pay

## 2021-05-02 DIAGNOSIS — Z6837 Body mass index (BMI) 37.0-37.9, adult: Secondary | ICD-10-CM | POA: Diagnosis not present

## 2021-05-02 DIAGNOSIS — L0291 Cutaneous abscess, unspecified: Secondary | ICD-10-CM | POA: Diagnosis not present

## 2021-05-02 DIAGNOSIS — R634 Abnormal weight loss: Secondary | ICD-10-CM | POA: Diagnosis not present

## 2021-05-02 MED ORDER — SULFAMETHOXAZOLE-TRIMETHOPRIM 800-160 MG PO TABS
ORAL_TABLET | ORAL | 0 refills | Status: DC
  Filled 2021-05-02: qty 20, 10d supply, fill #0

## 2021-05-02 MED ORDER — PHENTERMINE HCL 37.5 MG PO CAPS
ORAL_CAPSULE | ORAL | 0 refills | Status: DC
  Filled 2021-05-02: qty 90, 90d supply, fill #0

## 2021-10-24 ENCOUNTER — Other Ambulatory Visit: Payer: Self-pay

## 2021-10-24 DIAGNOSIS — Z6837 Body mass index (BMI) 37.0-37.9, adult: Secondary | ICD-10-CM | POA: Diagnosis not present

## 2021-10-24 DIAGNOSIS — Z0000 Encounter for general adult medical examination without abnormal findings: Secondary | ICD-10-CM | POA: Diagnosis not present

## 2021-10-24 DIAGNOSIS — E782 Mixed hyperlipidemia: Secondary | ICD-10-CM | POA: Diagnosis not present

## 2021-10-24 DIAGNOSIS — L03221 Cellulitis of neck: Secondary | ICD-10-CM | POA: Diagnosis not present

## 2021-10-24 MED ORDER — SIMVASTATIN 40 MG PO TABS
40.0000 mg | ORAL_TABLET | Freq: Every day | ORAL | 3 refills | Status: DC
  Filled 2021-10-24: qty 90, 90d supply, fill #0

## 2021-10-24 MED ORDER — DOXYCYCLINE HYCLATE 100 MG PO CAPS
100.0000 mg | ORAL_CAPSULE | Freq: Every day | ORAL | 3 refills | Status: DC
  Filled 2021-10-24: qty 75, 75d supply, fill #0
  Filled 2021-10-24: qty 90, 90d supply, fill #0

## 2021-10-25 ENCOUNTER — Other Ambulatory Visit: Payer: Self-pay

## 2021-10-26 ENCOUNTER — Other Ambulatory Visit: Payer: Self-pay

## 2022-01-09 DIAGNOSIS — H5213 Myopia, bilateral: Secondary | ICD-10-CM | POA: Diagnosis not present

## 2022-03-02 IMAGING — CT CT RENAL STONE PROTOCOL
2 of 4 series · 16 of 46 positions shown, 18 images · non-contrast
Comparison: 04/19/2015

CLINICAL DATA: Left lower quadrant abdominal pain with a history of
kidney stones.

EXAM:
CT ABDOMEN AND PELVIS WITHOUT CONTRAST
TECHNIQUE: Multidetector CT imaging of the abdomen and pelvis was performed
following the standard protocol without IV contrast.

[Series 2: stone full standard · axial · 0.82mm/px · z∈[-864,-404]mm · 13 of 102 slices shown, 15 images]
[im 5/102  soft-tissue]
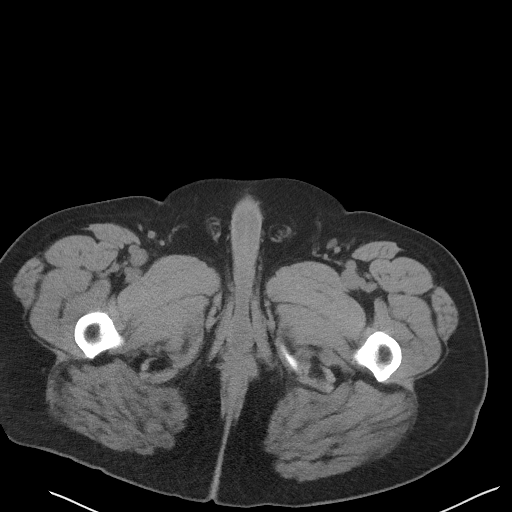
[im 5/102  bone]
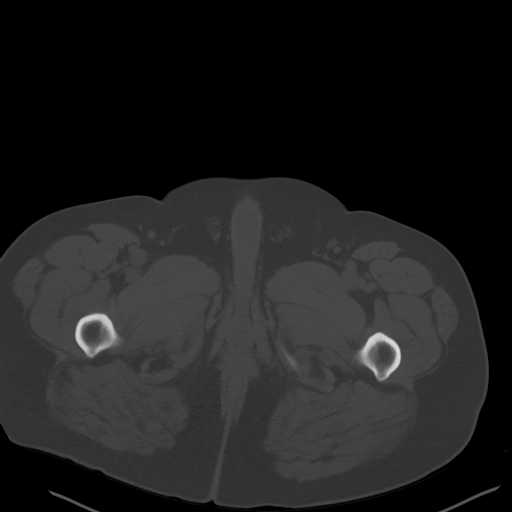
[im 14/102  soft-tissue]
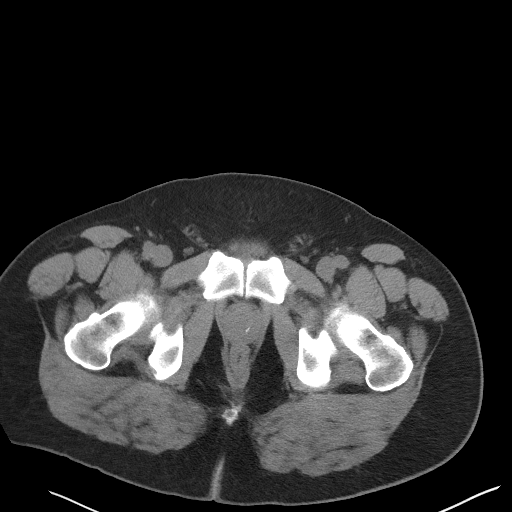
[im 22/102  soft-tissue]
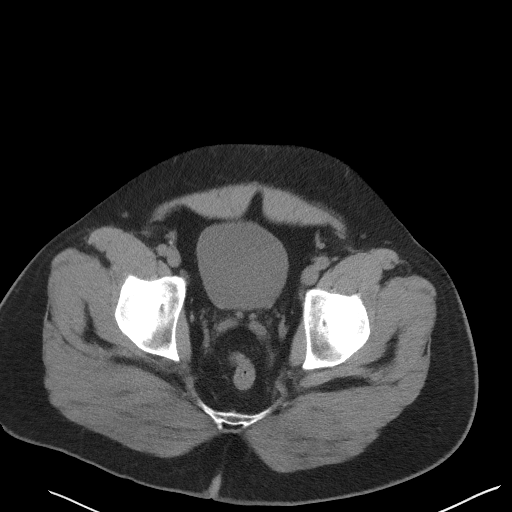
[im 27/102  soft-tissue]
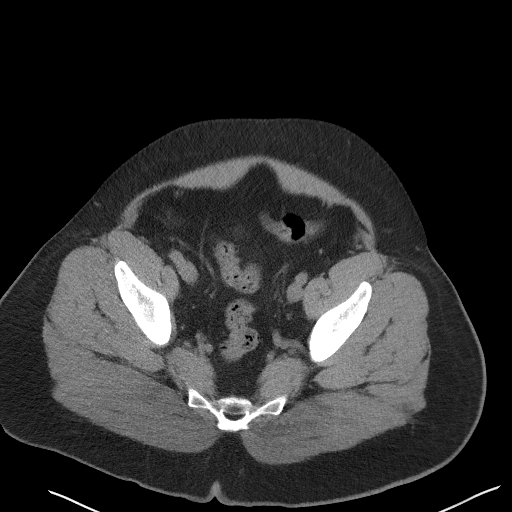
[im 36/102  soft-tissue]
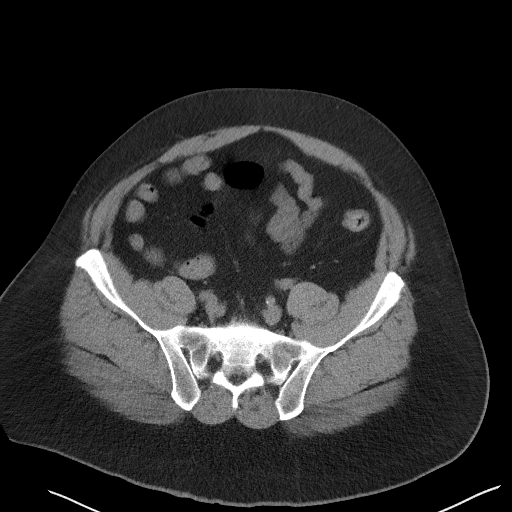
[im 44/102  soft-tissue]
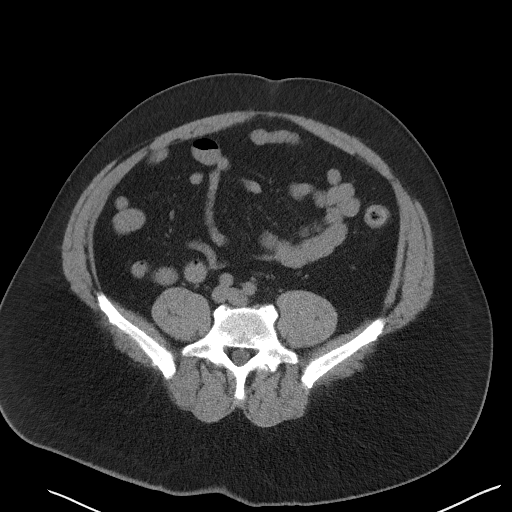
[im 53/102  soft-tissue]
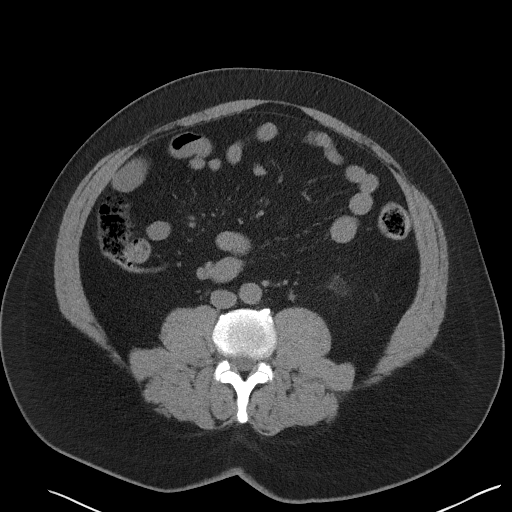
[im 58/102  soft-tissue]
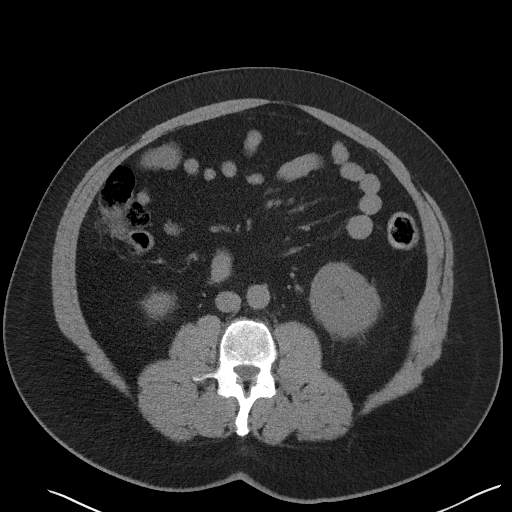
[im 66/102  soft-tissue]
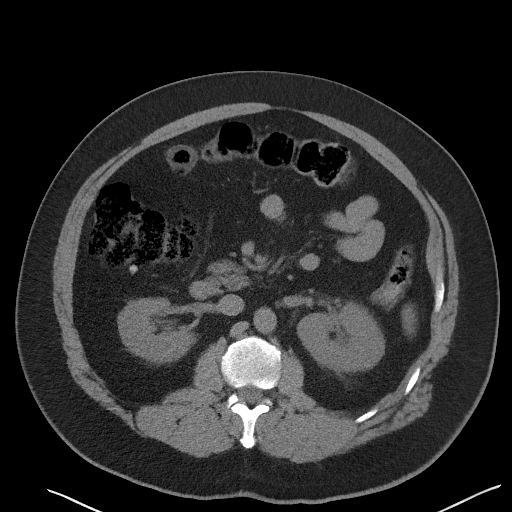
[im 66/102  bone]
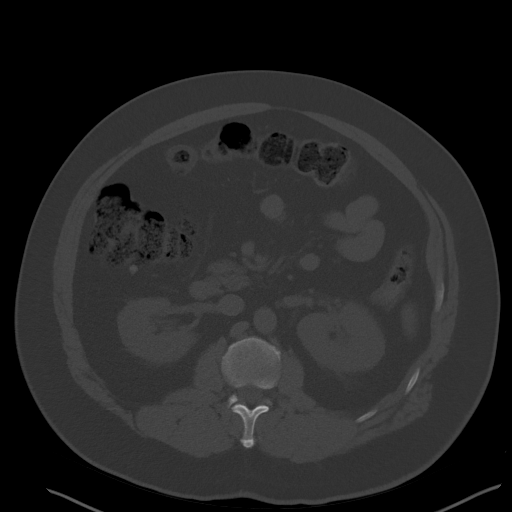
[im 75/102  soft-tissue]
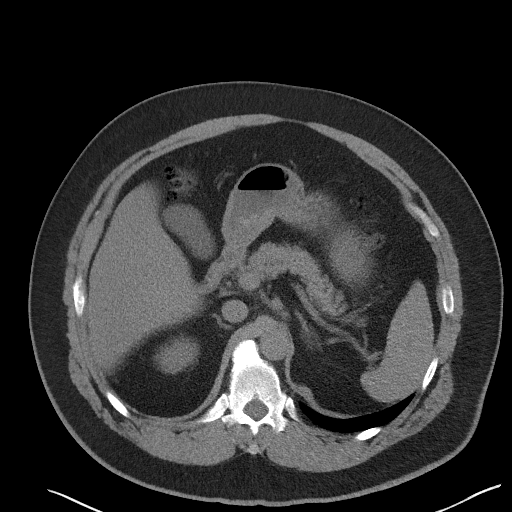
[im 80/102  soft-tissue]
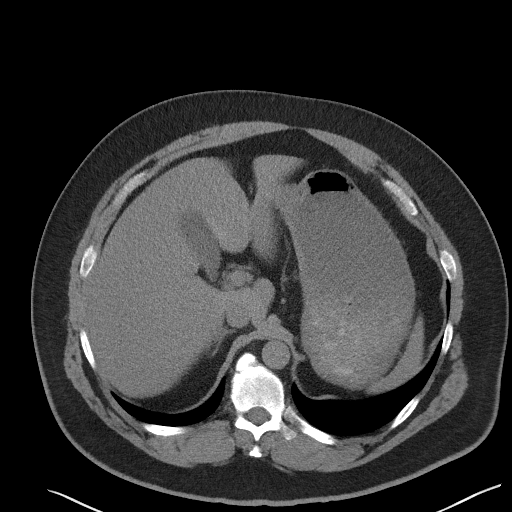
[im 88/102  soft-tissue]
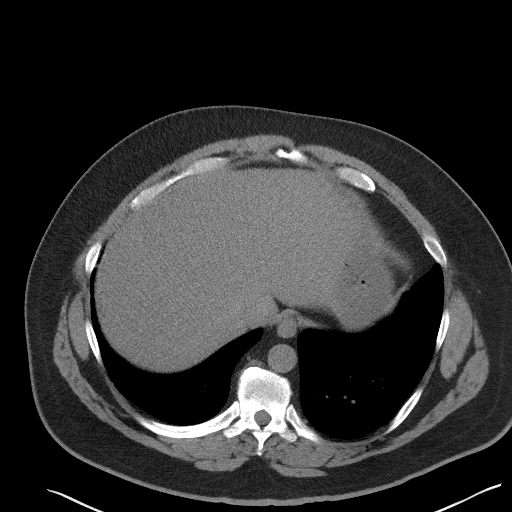
[im 97/102  soft-tissue]
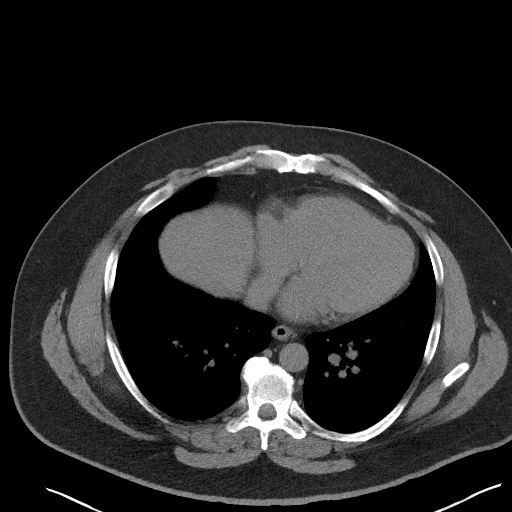

[Series 5: coronal · coronal · 0.84mm/px · 3 of 165 slices shown]
[im 55/165  soft-tissue]
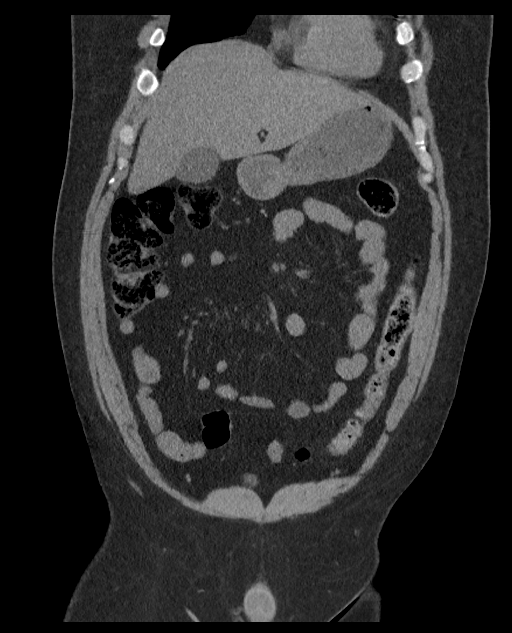
[im 73/165  soft-tissue]
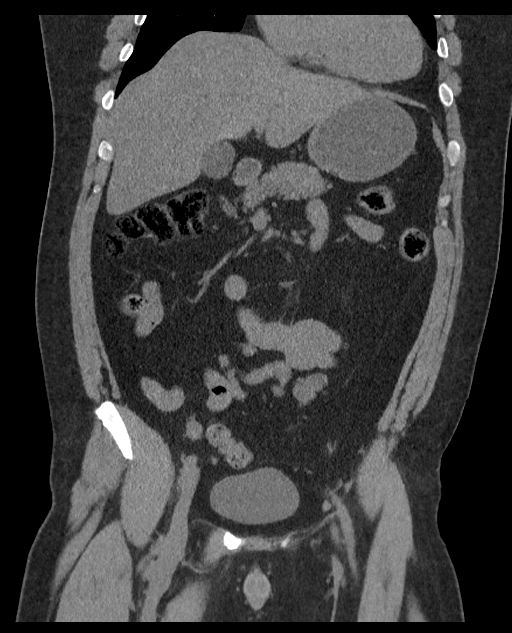
[im 92/165  soft-tissue]
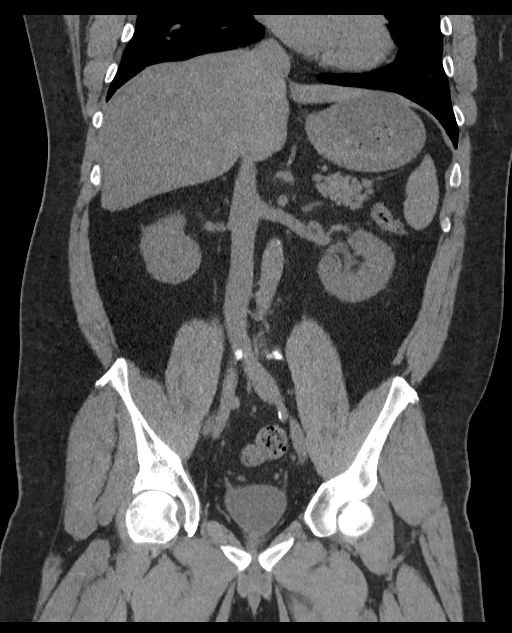

[16 of 46 positions shown; findings below may reference images not displayed]

FINDINGS: Lower chest: The lung bases are clear. The heart size is normal.

Hepatobiliary: There is decreased hepatic attenuation suggestive of
hepatic steatosis. Normal gallbladder.There is no biliary ductal
dilation.

Pancreas: Normal contours without ductal dilatation. No
peripancreatic fluid collection.

Spleen: Unremarkable.

Adrenals/Urinary Tract:

--Adrenal glands: Unremarkable.

--Right kidney/ureter: There are multiple punctate nonobstructing
stones involving the right kidney.

--Left kidney/ureter: There is mild left-sided hydronephrosis
secondary to an obstructing 3 mm stone in the mid left ureter.

--Urinary bladder: Unremarkable.

Stomach/Bowel:

--Stomach/Duodenum: No hiatal hernia or other gastric abnormality.
Normal duodenal course and caliber.

--Small bowel: Unremarkable.

--Colon: Unremarkable.

--Appendix: Normal.

Vascular/Lymphatic: Normal course and caliber of the major abdominal
vessels.

--No retroperitoneal lymphadenopathy.

--No mesenteric lymphadenopathy.

--No pelvic or inguinal lymphadenopathy.

Reproductive: Unremarkable

Other: No ascites or free air. The abdominal wall is normal.

Musculoskeletal. No acute displaced fractures.
IMPRESSION: 1. Mild left-sided hydronephrosis secondary to partially obstructing
3 mm stone in the mid left ureter.
2. Multiple punctate nonobstructing stones involving the right
kidney.
3. Hepatic steatosis.

## 2022-03-09 IMAGING — CR DG ABDOMEN 1V
1 series · 2 of 2 positions shown · non-contrast
Comparison: CT, 07/29/2019.

CLINICAL DATA: Follow-up for bilateral kidney stones. No current
abdominal pain.

EXAM:
ABDOMEN - 1 VIEW

[Series 1: dg abd 1 view · 0.14mm/px · 2 of 2 slices shown]
[im 1/2]
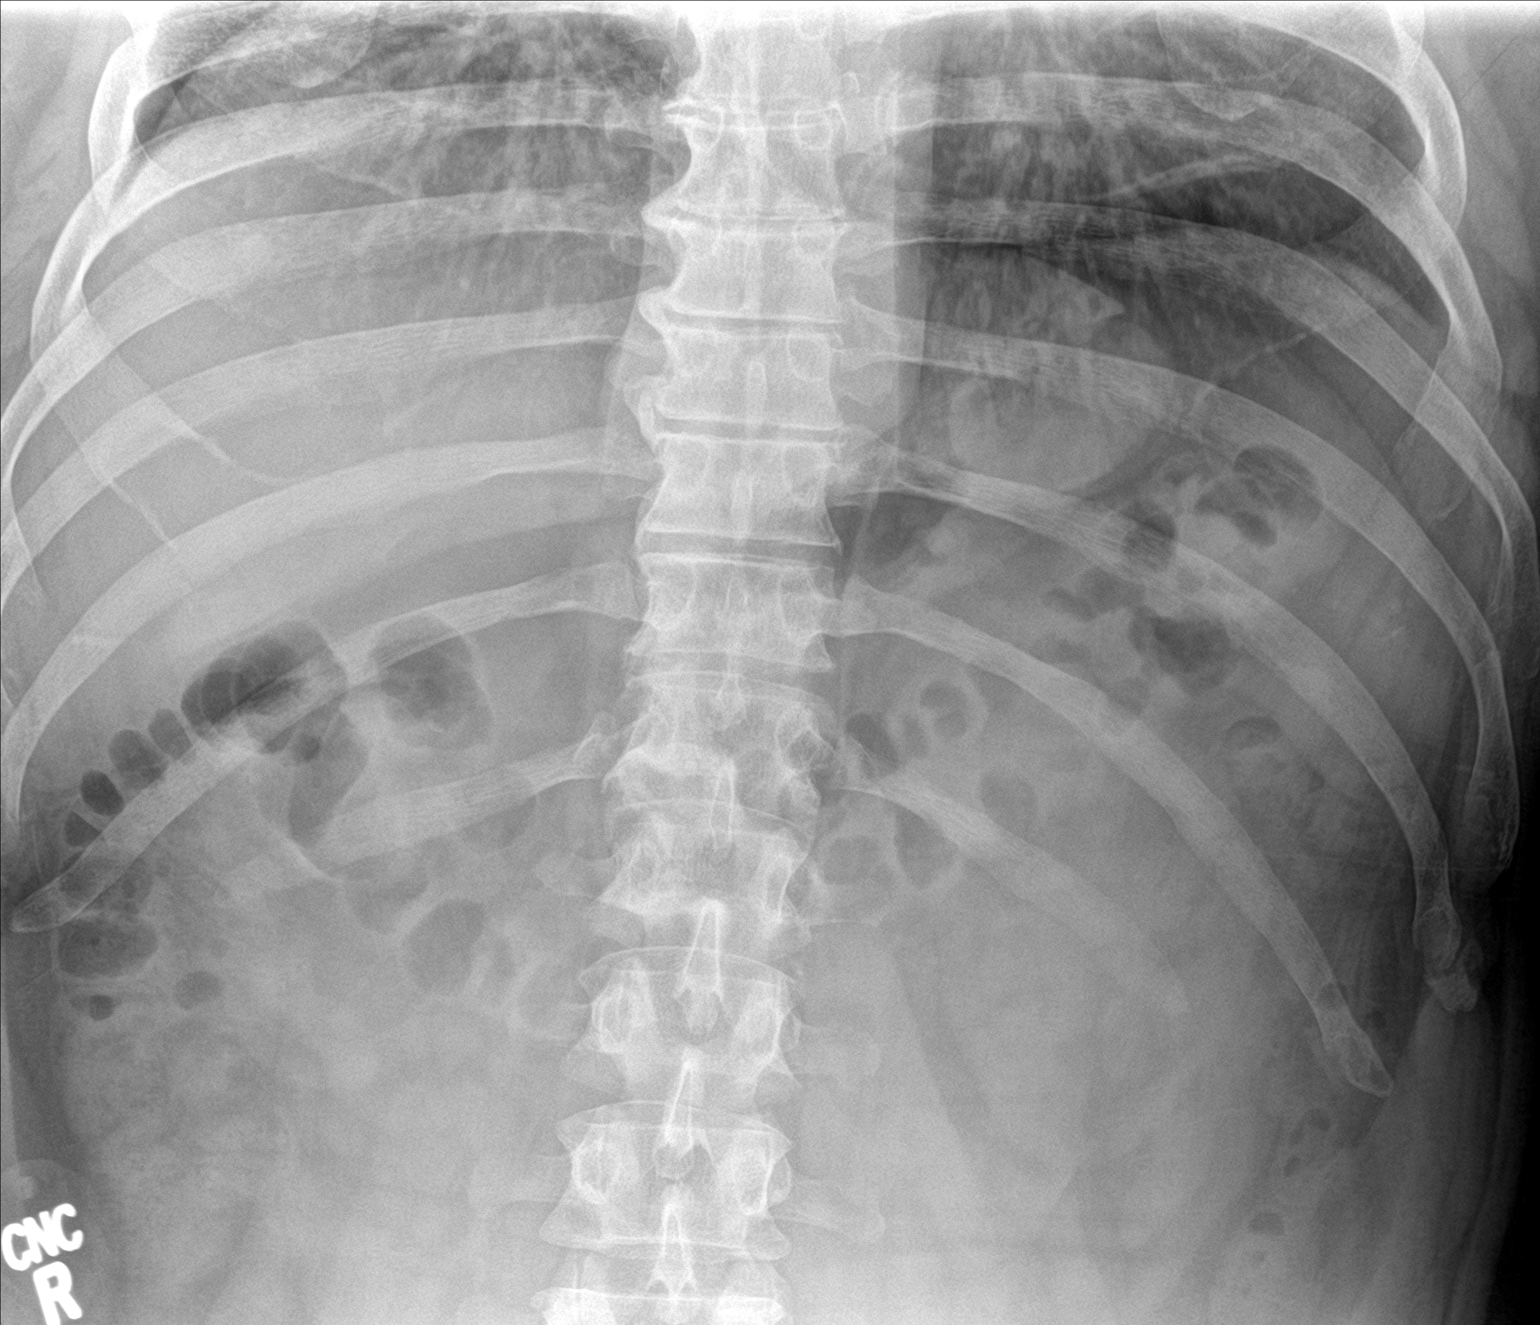
[im 2/2]
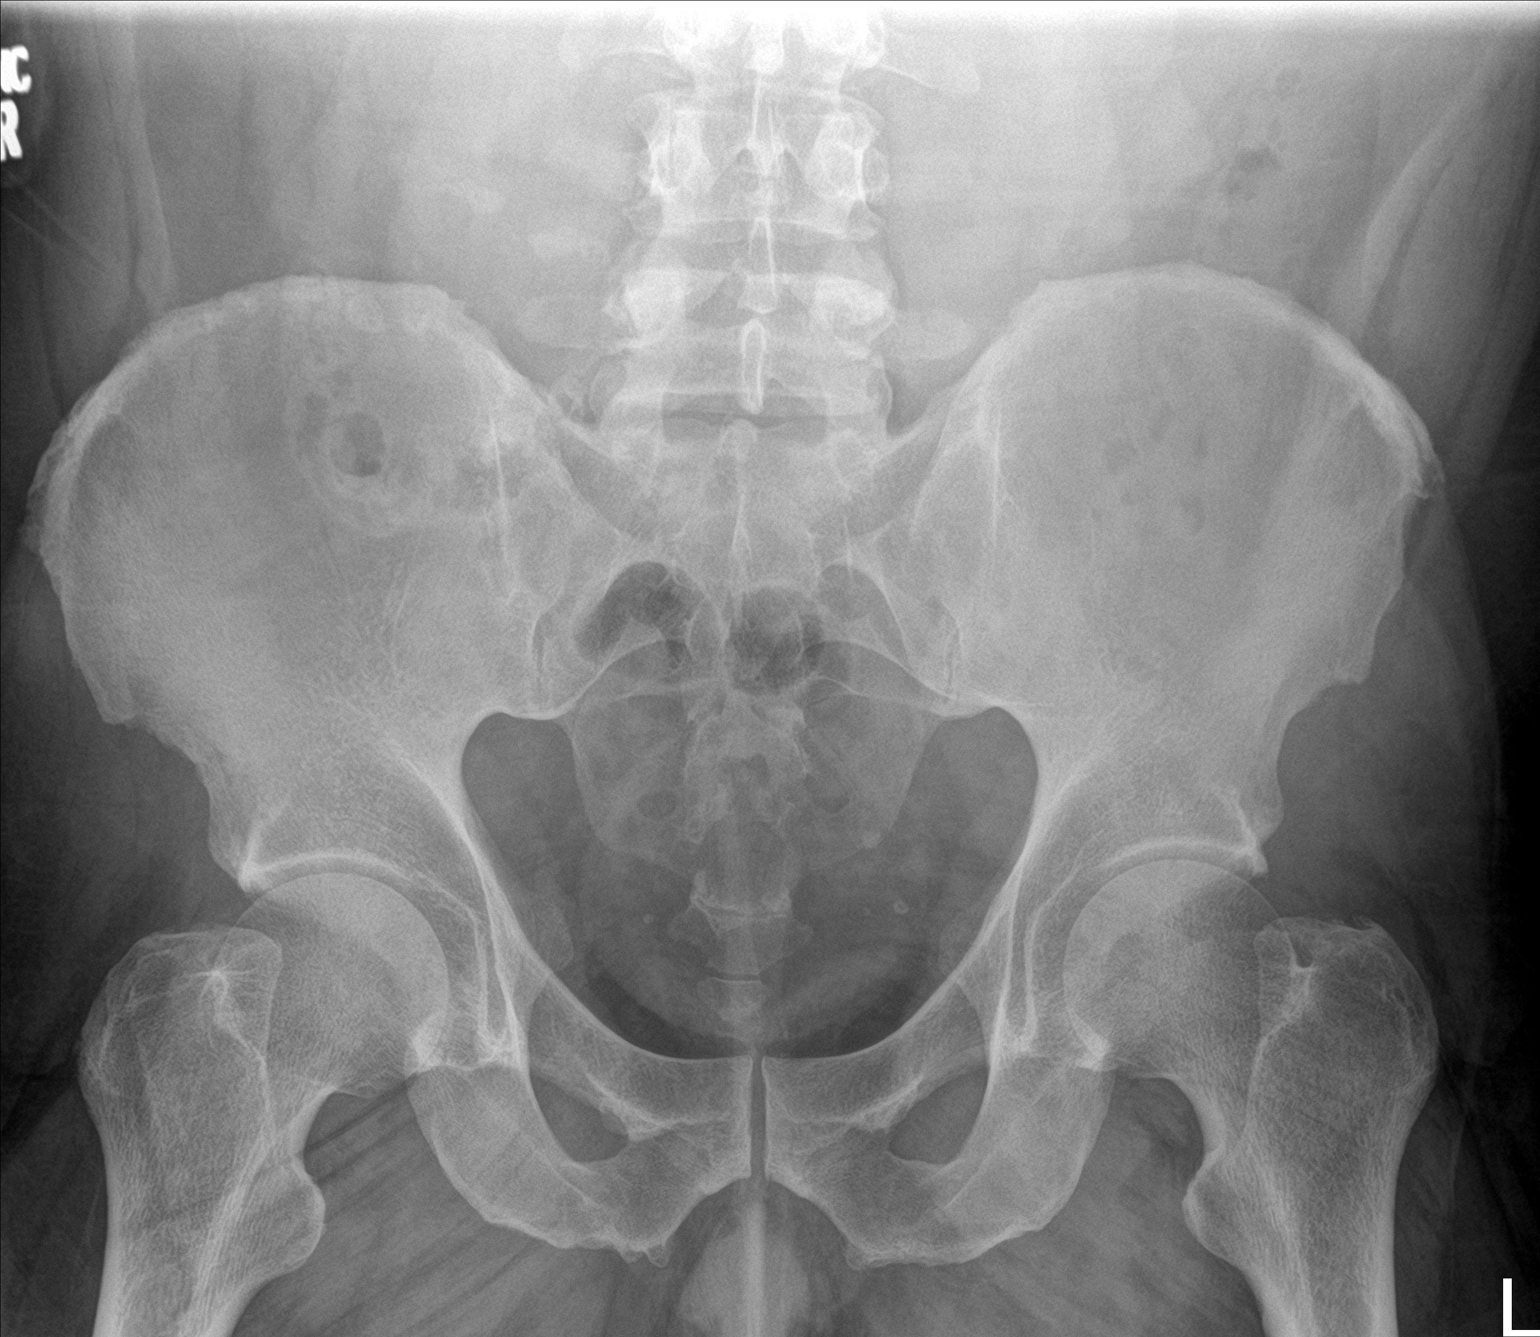

[2 of 2 positions shown; findings below may reference images not displayed]

FINDINGS: Normal bowel gas pattern.

Subtle small density noted projecting in the upper pole the right
kidney which likely reflects the stone noted in the upper pole the
right kidney on the prior CT. No other evidence of a renal stone.

Small density projects over the left sacral margin in the left
central pelvis, which could reflect a distal ureteral stone or a
vascular calcification/phlebolith. No other evidence of a ureteral
stone. There are additional small lower pelvis calcifications
consistent with stable phleboliths.

Soft tissues are otherwise unremarkable.

No significant skeletal abnormality.
IMPRESSION: 1. Small density in the left central pelvis could reflect a distal
left ureteral stone or a vascular calcification.
2. Small stone in the upper right kidney, not well-defined
radiographically, but consistent with a stone seen in this location
on the recent prior CT.

## 2022-05-17 ENCOUNTER — Other Ambulatory Visit: Payer: Self-pay

## 2022-05-17 MED ORDER — PHENTERMINE HCL 37.5 MG PO TABS
37.5000 mg | ORAL_TABLET | Freq: Every day | ORAL | 0 refills | Status: DC
  Filled 2022-05-19: qty 90, 90d supply, fill #0

## 2022-05-19 ENCOUNTER — Other Ambulatory Visit: Payer: Self-pay

## 2022-07-25 ENCOUNTER — Other Ambulatory Visit: Payer: Self-pay

## 2022-07-25 ENCOUNTER — Emergency Department: Payer: 59

## 2022-07-25 ENCOUNTER — Observation Stay
Admit: 2022-07-25 | Discharge: 2022-07-25 | Disposition: A | Discharge status: Home / Self Care | Payer: 59 | Attending: Cardiology | Admitting: Cardiology

## 2022-07-25 ENCOUNTER — Inpatient Hospital Stay
Admission: EM | Admit: 2022-07-25 | Discharge: 2022-07-27 | DRG: 322 | Disposition: A | Discharge status: Home / Self Care | Payer: 59 | Attending: Family Medicine | Admitting: Family Medicine

## 2022-07-25 ENCOUNTER — Observation Stay (HOSPITAL_COMMUNITY)
Admit: 2022-07-25 | Discharge: 2022-07-25 | Disposition: A | Discharge status: Home / Self Care | Payer: 59 | Attending: Cardiology | Admitting: Cardiology

## 2022-07-25 DIAGNOSIS — R778 Other specified abnormalities of plasma proteins: Secondary | ICD-10-CM | POA: Diagnosis not present

## 2022-07-25 DIAGNOSIS — E669 Obesity, unspecified: Secondary | ICD-10-CM | POA: Diagnosis present

## 2022-07-25 DIAGNOSIS — R079 Chest pain, unspecified: Secondary | ICD-10-CM

## 2022-07-25 DIAGNOSIS — E785 Hyperlipidemia, unspecified: Secondary | ICD-10-CM

## 2022-07-25 DIAGNOSIS — I2510 Atherosclerotic heart disease of native coronary artery without angina pectoris: Secondary | ICD-10-CM | POA: Diagnosis present

## 2022-07-25 DIAGNOSIS — E7800 Pure hypercholesterolemia, unspecified: Secondary | ICD-10-CM | POA: Diagnosis present

## 2022-07-25 DIAGNOSIS — T463X5A Adverse effect of coronary vasodilators, initial encounter: Secondary | ICD-10-CM | POA: Diagnosis not present

## 2022-07-25 DIAGNOSIS — I214 Non-ST elevation (NSTEMI) myocardial infarction: Secondary | ICD-10-CM | POA: Diagnosis not present

## 2022-07-25 DIAGNOSIS — Z6839 Body mass index (BMI) 39.0-39.9, adult: Secondary | ICD-10-CM

## 2022-07-25 DIAGNOSIS — Z87891 Personal history of nicotine dependence: Secondary | ICD-10-CM

## 2022-07-25 DIAGNOSIS — K219 Gastro-esophageal reflux disease without esophagitis: Secondary | ICD-10-CM | POA: Diagnosis present

## 2022-07-25 DIAGNOSIS — G4440 Drug-induced headache, not elsewhere classified, not intractable: Secondary | ICD-10-CM | POA: Diagnosis not present

## 2022-07-25 DIAGNOSIS — Z79899 Other long term (current) drug therapy: Secondary | ICD-10-CM

## 2022-07-25 HISTORY — DX: Hyperlipidemia, unspecified: E78.5

## 2022-07-25 HISTORY — DX: Obesity, unspecified: E66.9

## 2022-07-25 HISTORY — DX: Palpitations: R00.2

## 2022-07-25 LAB — BASIC METABOLIC PANEL
Anion gap: 8 (ref 5–15)
BUN: 11 mg/dL (ref 6–20)
CO2: 22 mmol/L (ref 22–32)
Calcium: 9.9 mg/dL (ref 8.9–10.3)
Chloride: 107 mmol/L (ref 98–111)
Creatinine, Ser: 1.07 mg/dL (ref 0.61–1.24)
GFR, Estimated: >60 mL/min (ref >60)
Glucose, Bld: 109 mg/dL — ABNORMAL HIGH (ref 70–99)
Potassium: 4.4 mmol/L (ref 3.5–5.1)
Sodium: 137 mmol/L (ref 135–145)

## 2022-07-25 LAB — CBC
HCT: 44.8 % (ref 39.0–52.0)
Hemoglobin: 14.5 g/dL (ref 13.0–17.0)
MCH: 29.6 pg (ref 26.0–34.0)
MCHC: 32.4 g/dL (ref 30.0–36.0)
MCV: 91.4 fL (ref 80.0–100.0)
Platelets: 251 10*3/uL (ref 150–400)
RBC: 4.9 MIL/uL (ref 4.22–5.81)
RDW: 12.6 % (ref 11.5–15.5)
WBC: 7 10*3/uL (ref 4.0–10.5)
nRBC: 0 % (ref 0.0–0.2)

## 2022-07-25 LAB — TROPONIN I (HIGH SENSITIVITY)
Troponin I (High Sensitivity): 95 ng/L — ABNORMAL HIGH (ref <18)
Troponin I (High Sensitivity): 185 ng/L (ref <18)
Troponin I (High Sensitivity): 314 ng/L (ref <18)

## 2022-07-25 LAB — ECHOCARDIOGRAM COMPLETE
Height: 65 in
Weight: 3760 oz

## 2022-07-25 LAB — APTT: aPTT: 27 seconds (ref 24–36)

## 2022-07-25 LAB — PROTIME-INR
INR: 1 (ref 0.8–1.2)
Prothrombin Time: 13.8 seconds (ref 11.4–15.2)

## 2022-07-25 LAB — HEPARIN LEVEL (UNFRACTIONATED): Heparin Unfractionated: 0.26 IU/mL — ABNORMAL LOW (ref 0.30–0.70)

## 2022-07-25 MED ORDER — ASPIRIN 325 MG PO TABS
325.0000 mg | ORAL_TABLET | Freq: Every day | ORAL | Status: DC
  Administered 2022-07-25 – 2022-07-26 (×2): 325 mg via ORAL
  Filled 2022-07-25 (×2): qty 1

## 2022-07-25 MED ORDER — HEPARIN BOLUS VIA INFUSION
1300.0000 [IU] | Freq: Once | INTRAVENOUS | Status: AC
  Administered 2022-07-25: 1300 [IU] via INTRAVENOUS
  Filled 2022-07-25: qty 1300

## 2022-07-25 MED ORDER — NITROGLYCERIN 2 % TD OINT
1.0000 [in_us] | TOPICAL_OINTMENT | Freq: Four times a day (QID) | TRANSDERMAL | Status: DC
  Administered 2022-07-26: 1 [in_us] via TOPICAL
  Filled 2022-07-25: qty 1

## 2022-07-25 MED ORDER — HEPARIN (PORCINE) 25000 UT/250ML-% IV SOLN
1400.0000 [IU]/h | INTRAVENOUS | Status: DC
  Administered 2022-07-25: 1200 [IU]/h via INTRAVENOUS
  Administered 2022-07-26: 1400 [IU]/h via INTRAVENOUS
  Filled 2022-07-25 (×2): qty 250

## 2022-07-25 MED ORDER — HEPARIN BOLUS VIA INFUSION
4000.0000 [IU] | Freq: Once | INTRAVENOUS | Status: AC
  Administered 2022-07-25: 4000 [IU] via INTRAVENOUS
  Filled 2022-07-25: qty 4000

## 2022-07-25 MED ORDER — ENOXAPARIN SODIUM 60 MG/0.6ML IJ SOSY: 52.0000 mg | PREFILLED_SYRINGE | INTRAMUSCULAR | Status: DC

## 2022-07-25 MED ORDER — ENOXAPARIN SODIUM 40 MG/0.4ML IJ SOSY: 40.0000 mg | PREFILLED_SYRINGE | INTRAMUSCULAR | Status: DC

## 2022-07-25 MED ORDER — PERFLUTREN LIPID MICROSPHERE
1.0000 mL | INTRAVENOUS | Status: AC | PRN
  Administered 2022-07-25: 3 mL via INTRAVENOUS

## 2022-07-25 MED ORDER — ATORVASTATIN CALCIUM 20 MG PO TABS
40.0000 mg | ORAL_TABLET | Freq: Every day | ORAL | Status: DC
  Administered 2022-07-25 – 2022-07-27 (×3): 40 mg via ORAL
  Filled 2022-07-25 (×3): qty 2

## 2022-07-25 MED ORDER — ONDANSETRON HCL 4 MG/2ML IJ SOLN: 4.0000 mg | Freq: Four times a day (QID) | INTRAMUSCULAR | Status: DC | PRN

## 2022-07-25 MED ORDER — ACETAMINOPHEN 325 MG PO TABS
650.0000 mg | ORAL_TABLET | ORAL | Status: DC | PRN
  Administered 2022-07-26: 650 mg via ORAL
  Filled 2022-07-25: qty 2

## 2022-07-25 NOTE — ED Notes (Signed)
See triage note  Presents with chest pain  States pain started last pm  He took some TUMS  States pain returned this am with exertion  No n/v/ SOB

## 2022-07-25 NOTE — ED Provider Notes (Signed)
[facility] Provider Note    Event Date/Time   First MD Initiated Contact with Patient 07/25/22 [phone]     (approximate)   History   Chest Pain   HPI  Kevin Clark is a 42 y.o. male presenting to the emergency department for evaluation of chest pain.  Last night, patient had onset of chest pain described as a sharp pressure over the left-sided chest radiating down his left arm.  Had associated diaphoresis.  Tried taking Tums, but the pain woke him up while he was sleeping.  Denies history of similar pain.  Pain is worse when he is up and exerting himself.  History of high cholesterol, obesity.  Reports previous palpitations, otherwise no known cardiac history.  Had worsening pain this morning leading to ER presentation.  Does report it is improved at the time of my initial evaluation.    Physical Exam   Triage Vital Signs: ED Triage Vitals [07/25/22 0846]  Enc Vitals Group     BP (!) 162/119     Pulse Rate 72     Resp 18     Temp 98 F (36.7 C)     Temp src      SpO2 98 %     Weight 235 lb (106.6 kg)     Height 5\' 5"  (1.651 m)     Head Circumference      Peak Flow      Pain Score 4     Pain Loc      Pain Edu?      Excl. in GC?     Most recent vital signs: Vitals:   07/25/22 1235 07/25/22 1302  BP:  128/88  Pulse:  74  Resp:  18  Temp: 98 F (36.7 C) 98 F (36.7 C)  SpO2:  98%     General: Awake, interactive  CV:  Regular rate, good peripheral perfusion.  Resp:  Lungs clear, unlabored respirations.  Abd:  Soft, nondistended.  Neuro:  Symmetric facial movement, fluid speech   ED Results / Procedures / Treatments   Labs (all labs ordered are listed, but only abnormal results are displayed) Labs Reviewed  BASIC METABOLIC PANEL - Abnormal; Notable for the following components:      Result Value   Glucose, Bld 109 (*)    All other components within normal limits  TROPONIN I (HIGH SENSITIVITY) - Abnormal; Notable for the  following components:   Troponin I (High Sensitivity) 95 (*)    All other components within normal limits  CBC  HIV ANTIBODY (ROUTINE TESTING W REFLEX)  TROPONIN I (HIGH SENSITIVITY)     EKG EKG independently reviewed interpreted by myself (ER attending) demonstrates:  EKG demonstrates normal sinus rhythm at a rate of 76, PR 138, small Q waves in the inferior leads, no acute ST changes  RADIOLOGY Imaging independently reviewed and interpreted by myself demonstrates:  CXR without pneumonia or pneumothorax  PROCEDURES:  Critical Care performed: Yes  CRITICAL CARE Performed by: Trinna Post  Total critical care time: 30 minutes  Critical care time was exclusive of separately billable procedures and treating other patients.  Critical care was necessary to treat or prevent imminent or life-threatening deterioration.  Critical care was time spent personally by me on the following activities: development of treatment plan with patient and/or surrogate as well as nursing, discussions with consultants, evaluation of patient's response to treatment, examination of patient, obtaining history from patient or surrogate, ordering and performing treatments  and interventions, ordering and review of laboratory studies, ordering and review of radiographic studies, pulse oximetry and re-evaluation of patient's condition.   MEDICATIONS ORDERED IN ED: Medications  aspirin tablet 325 mg (325 mg Oral Given 07/25/22 0938)  acetaminophen (TYLENOL) tablet 650 mg (has no administration in time range)  ondansetron (ZOFRAN) injection 4 mg (has no administration in time range)  enoxaparin (LOVENOX) injection 52 mg (has no administration in time range)     IMPRESSION / MDM / ASSESSMENT AND PLAN / ED COURSE  I reviewed the triage vital signs and the nursing notes.  Differential diagnosis includes, but is not limited to, ACS, pneumothorax, pneumonia, low risk PE and PERC negative  Patient's presentation is  most consistent with acute presentation with potential threat to life or bodily function.  42 year old male presenting to the emergency department for evaluation of chest pain.  CBC and CMP without significant derangement.  Troponin elevated at 95 with concerning clinical history.  Minimal ongoing pain.  Patient ordered for 325 of aspirin.  Will reach out to hospitalist team for admission.  Case discussed with Dr. Alvester Morin.  He will evaluate the patient for anticipated admission.     FINAL CLINICAL IMPRESSION(S) / ED DIAGNOSES   Final diagnoses:  NSTEMI (non-ST elevated myocardial infarction) (HCC)     Rx / DC Orders   ED Discharge Orders     None        Note:  This document was prepared using Dragon voice recognition software and may include unintentional dictation errors.   Trinna Post, MD 07/25/22 1320

## 2022-07-25 NOTE — Assessment & Plan Note (Signed)
Check lipid panel High-dose statin

## 2022-07-25 NOTE — Progress Notes (Signed)
ANTICOAGULATION CONSULT NOTE - Initial Consult  Pharmacy Consult for IV heparin Indication: chest pain/ACS  No Known Allergies  Patient Measurements: Height: 5\' 5"  (165.1 cm) Weight: 106.6 kg (235 lb) IBW/kg (Calculated) : 61.5 Heparin Dosing Weight: 85.8 kg  Vital Signs: Temp: 98 F (36.7 C) (05/14 1302) BP: 128/88 (05/14 1302) Pulse Rate: 74 (05/14 1302)  Labs: Recent Labs    07/25/22 0848 07/25/22 1347  HGB 14.5  --   HCT 44.8  --   PLT 251  --   CREATININE 1.07  --   TROPONINIHS 95* 185*    Estimated Creatinine Clearance: 100.1 mL/min (by C-G formula based on SCr of 1.07 mg/dL).   Medical History: Past Medical History:  Diagnosis Date   Hyperlipidemia    Obesity    Palpitations     Medications:  No anticoagulation prior to admission  Assessment: 42 year old male presenting with chest pain. Troponins trending up on repeat.  Baseline H&H stable. Baseline aPTT and PT-INR ordered.  Goal of Therapy:  Heparin level 0.3-0.7 units/ml Monitor platelets by anticoagulation protocol: Yes   Plan:  Give 4000 units bolus x 1 Start heparin infusion at 1200 units/hr Check anti-Xa level in 6 hours and daily while on heparin Continue to monitor H&H and platelets    Elliot Gurney, PharmD, BCPS Clinical Pharmacist  07/25/2022 2:55 PM

## 2022-07-25 NOTE — ED Triage Notes (Signed)
Pt to ED for chest pain radiating to left arm started last night. Reports some dizziness.  Ambulatory to triage, NAD noted.

## 2022-07-25 NOTE — Assessment & Plan Note (Signed)
BMI 39 Discussed diet and lifestyle modification Follow

## 2022-07-25 NOTE — Progress Notes (Signed)
ANTICOAGULATION CONSULT NOTE  Pharmacy Consult for IV heparin Indication: chest pain/ACS  No Known Allergies  Patient Measurements: Height: 5\' 5"  (165.1 cm) Weight: 106.6 kg (235 lb) IBW/kg (Calculated) : 61.5 Heparin Dosing Weight: 85.8 kg  Vital Signs: Temp: 98.2 F (36.8 C) (05/14 1900) BP: 120/74 (05/14 1900) Pulse Rate: 74 (05/14 1900)  Labs: Recent Labs    07/25/22 0848 07/25/22 1347 07/25/22 1511 07/25/22 1720 07/25/22 2139  HGB 14.5  --   --   --   --   HCT 44.8  --   --   --   --   PLT 251  --   --   --   --   APTT  --   --  27  --   --   LABPROT  --   --  13.8  --   --   INR  --   --  1.0  --   --   HEPARINUNFRC  --   --   --   --  0.26*  CREATININE 1.07  --   --   --   --   TROPONINIHS 95* 185*  --  314*  --      Estimated Creatinine Clearance: 100.1 mL/min (by C-G formula based on SCr of 1.07 mg/dL).   Medical History: Past Medical History:  Diagnosis Date   Hyperlipidemia    Obesity    Palpitations     Medications:  No anticoagulation prior to admission  Assessment: 42 year old male presenting with chest pain. Troponins trending up on repeat.  Baseline H&H stable. Baseline aPTT and PT-INR ordered.  Goal of Therapy:  Heparin level 0.3-0.7 units/ml Monitor platelets by anticoagulation protocol: Yes   05/14 2139 HL 0.26, subtherapeutic  Plan:  Bolus 1300 units x 1 Increase heparin infusion to 1400 units/hr Recheck HL w/ AM labs after rate change CBC daily while on heparin  Otelia Sergeant, PharmD, [facility] 07/25/2022 10:19 PM

## 2022-07-25 NOTE — Consult Note (Signed)
Cardiology Consultation   Patient ID: Kevin Clark MRN: [id number]; DOB: [birth year]  Admit date: 07/25/2022 Date of Consult: 07/25/2022  PCP:  Kevin Dawn, MD   [facility] Providers Cardiologist:  None      New consult done by Dr. Okey Dupre  Patient Profile:   Kevin Clark is a 42 y.o. male with a hx of hyperlipidemia, obesity, former smoker who is being seen 07/25/2022 for the evaluation of chest pain at the request of Dr. Alvester Clark.  History of Present Illness:   Kevin Clark is a 42 year old male with a medical history of hyperlipidemia that he currently takes simvastatin for, obesity, intermittent alcohol use 3-4 beers a week.  He stated during his college years he had bouts with palpitations but was likely component of stress and caffeine wore a heart monitor at that time that was on founding.  After graduation he had no further issues.  He presented to the [facility] emergency department this morning with complaints of chest pain radiating down his left arm that started last night.  He denied any associated shortness of breath but did have some associated dizziness and diaphoresis.  He stated that yesterday evening he had some different sensation in his chest that radiated into his left arm that was hard to describe but states that it did not feel like the previous acid reflux that he suffers from routinely.  He stated that he had taken some Tums and had gone to bed and at that point he believed it resolved because he was able to go to sleep.  Throughout the night he did have to get up to urinate and on exertion of moving through the house going to the bathroom this sensation happened again.  His chest discomfort was resolved on evaluation in the emergency department.  Initial vital signs: Blood pressure 162/119, pulse was 72, respirations of 18, temperature of 98  Pertinent labs: Blood glucose 109, high-sensitivity troponin of 95, CBC was unrevealing  Imaging: Chest x-ray with  no acute process  Medications administered in the emergency department: Aspirin 325 mg   Past Medical History:  Diagnosis Date   Hyperlipidemia    Obesity    Palpitations     History reviewed. No pertinent surgical history.   Home Medications:  Prior to Admission medications   Medication Sig Start Date End Date Taking? Authorizing Provider  doxycycline (DORYX) 100 MG EC tablet Take 100 mg by mouth daily.   Yes [provider]  phentermine (ADIPEX-P) 37.5 MG tablet Take 1 tablet (37.5 mg total) by mouth daily before breakfast. 05/17/22  Yes   simvastatin (ZOCOR) 40 MG tablet Take 1 tablet (40 mg total) by mouth daily. 10/24/21  Yes     Inpatient Medications: Scheduled Meds:  aspirin  325 mg Oral Daily   enoxaparin (LOVENOX) injection  52 mg Subcutaneous Q24H   Continuous Infusions:  PRN Meds: acetaminophen, ondansetron (ZOFRAN) IV  Allergies:   No Known Allergies  Social History:   Social History   Socioeconomic History   Marital status: Married    Spouse name: Not on file   Number of children: Not on file   Years of education: Not on file   Highest education level: Not on file  Occupational History   Not on file  Tobacco Use   Smoking status: Never   Smokeless tobacco: Never  Substance and Sexual Activity   Alcohol use: Yes   Drug use: Never   Sexual activity: Not on file  Other Topics Concern   Not on file  Social History Narrative   Not on file   Social Determinants of Health   Financial Resource Strain: Not on file  Food Insecurity: No Food Insecurity (07/25/2022)   Hunger Vital Sign    Worried About Running Out of Food in the Last Year: Never true    Ran Out of Food in the Last Year: Never true  Transportation Needs: No Transportation Needs (07/25/2022)   PRAPARE - Administrator, Civil Service (Medical): No    Lack of Transportation (Non-Medical): No  Physical Activity: Not on file  Stress: Not on file  Social Connections: Not  on file  Intimate Partner Violence: Not At Risk (07/25/2022)   Humiliation, Afraid, Rape, and Kick questionnaire    Fear of Current or Ex-Partner: No    Emotionally Abused: No    Physically Abused: No    Sexually Abused: No    Family History:   History reviewed. No pertinent family history.   ROS:  Please see the history of present illness.  Review of Systems  Constitutional:  Positive for diaphoresis.  Cardiovascular:  Positive for chest pain.  Neurological:  Positive for dizziness.    All other ROS reviewed and negative.     Physical Exam/Data:   Vitals:   07/25/22 0846 07/25/22 1235 07/25/22 1302  BP: (!) 162/119  128/88  Pulse: 72  74  Resp: 18  18  Temp: 98 F (36.7 C) 98 F (36.7 C) 98 F (36.7 C)  SpO2: 98%  98%  Weight: 106.6 kg    Height: 5\' 5"  (1.651 m)     No intake or output data in the 24 hours ending 07/25/22 1446    07/25/2022    8:46 AM 08/05/2019   10:59 AM 07/29/2019    1:12 AM  Last 3 Weights  Weight (lbs) 235 lb 235 lb 240 lb  Weight (kg) 106.595 kg 106.595 kg 108.863 kg     Body mass index is 39.11 kg/m.  General:  Well nourished, well developed, in no acute distress HEENT: normal Neck: no JVD Vascular: No carotid bruits; Distal pulses 2+ bilaterally Cardiac:  normal S1, S2; RRR; no murmur  Lungs:  clear to auscultation bilaterally, no wheezing, rhonchi or rales  Abd: soft, nontender, no hepatomegaly  Ext: no edema Musculoskeletal:  No deformities, BUE and BLE strength normal and equal Skin: warm and dry  Neuro:  CNs 2-12 intact, no focal abnormalities noted Psych:  Normal affect   EKG:  The EKG was personally reviewed and demonstrates: Sinus rhythm rate of 76 Telemetry:  Telemetry was personally reviewed and demonstrates: Sinus bradycardia to sinus rhythm 50s and 60s  Relevant CV Studies: Echocardiogram ordered and pending  Laboratory Data:  High Sensitivity Troponin:   Recent Labs  Lab 07/25/22 0848 07/25/22 1347   TROPONINIHS 95* 185*     Chemistry Recent Labs  Lab 07/25/22 0848  NA 137  K 4.4  CL 107  CO2 22  GLUCOSE 109*  BUN 11  CREATININE 1.07  CALCIUM 9.9  GFRNONAA >60  ANIONGAP 8    No results for input(s): "PROT", "ALBUMIN", "AST", "ALT", "ALKPHOS", "BILITOT" in the last 168 hours. Lipids No results for input(s): "CHOL", "TRIG", "HDL", "LABVLDL", "LDLCALC", "CHOLHDL" in the last 168 hours.  Hematology Recent Labs  Lab 07/25/22 0848  WBC 7.0  RBC 4.90  HGB 14.5  HCT 44.8  MCV 91.4  MCH 29.6  MCHC 32.4  RDW 12.6  PLT 251   Thyroid No results for input(s): "TSH", "FREET4" in the last 168 hours.  BNPNo results for input(s): "BNP", "PROBNP" in the last 168 hours.  DDimer No results for input(s): "DDIMER" in the last 168 hours.   Radiology/Studies:  DG Chest 2 View  Result Date: 07/25/2022 CLINICAL DATA:  Chest pain. EXAM: CHEST - 2 VIEW COMPARISON:  None Available. FINDINGS: Clear lungs. Normal heart size and mediastinal contours. No pleural effusion or pneumothorax. Visualized bones and upper abdomen are unremarkable. IMPRESSION: No evidence of acute cardiopulmonary disease. Electronically Signed   By: Orvan Falconer M.D.   On: 07/25/2022 09:23     Assessment and Plan:   Chest pain -Patient with 2 separate episodes of substernal chest discomfort beginning last night and then again this morning -Currently chest pain-free -High-sensitivity troponin 95, will continue to trend -Continue with telemetry monitoring -EKG for pain or changes -Echocardiogram ordered and pending with further recommendations to follow -After trending troponin will determine if patient would benefit from stress testing versus cardiac catheterization -If second troponin continues to trend up we will start heparin infusion  -Continue on aspirin and statin therapy  Hyperlipidemia -Lipid panel for a.m. -Continue on statin therapy -LDL 69 in 8/23  Obesity -BMI 39.11 -Recommend weight loss  with decrease caloric intake and increasing activity   Risk Assessment/Risk Scores:     TIMI Risk Score for Unstable Angina or Non-ST Elevation MI:   The patient's TIMI risk score is 4, which indicates a 20% risk of all cause mortality, new or recurrent myocardial infarction or need for urgent revascularization in the next 14 days.          For questions or updates, please contact  [facility] Please consult [email or web address] for contact info under    Signed, Arran Fessel, NP  07/25/2022 2:46 PM

## 2022-07-25 NOTE — Assessment & Plan Note (Signed)
Patient with recurrent central chest pain since last night Recurrent chest pain with exertion as well as diaphoresis. Risk factors include obesity, hyperlipidemia Troponin in the 90s EKG fairly stable though with some?  Anterior ST changes Heart score around 6 Status post full dose aspirin Will reach out to cardiology for further recommendations Trend troponin Risk stratification labs Follow closely

## 2022-07-25 NOTE — H&P (Signed)
History and Physical    Patient: Kevin Clark MWN:[id number] DOB: 08/23/1980 DOA: 07/25/2022 DOS: the patient was seen and examined on 07/25/2022 PCP: Lupita Dawn, MD  Patient coming from: Home  Chief Complaint:  Chief Complaint  Patient presents with   Chest Pain   HPI: Kevin Clark is a 42 y.o. male with medical history significant of obesity, hyperlipidemia, intermittent alcohol use presenting with NSTEMI and chest pain.  Patient reports having some moderate to severe chest pain since last night.  Patient states he tried takes Tums with no relief.  Patient states he eventually went to sleep and symptoms recurred this morning when he tried to get his kids ready for bed.  Does appear to have recurrent chest pain with exertion.  Also with some mild diaphoresis.  No reported radiation up the neck or down the left arm.  No shortness of breath nausea or abdominal pain.  No hemiparesis or confusion.  Denies any tobacco use.  Does drink 3-4 beers a week, though does drink More heavily while fishing.  Baseline hyperlipidemia on a statin.  Denies any known family history of coronary disease. Presented to the ER afebrile, blood pressures 160s over 110s.  Satting well on room air.  White count 7, hemoglobin 14.5, creatinine 1.07.  Troponin 95.  Chest x-ray stable.  EKG normal sinus rhythm with some?  ST changes in anterior leads. Review of Systems: As mentioned in the history of present illness. All other systems reviewed and are negative. History reviewed. No pertinent past medical history. History reviewed. No pertinent surgical history. Social History:  reports that he has never smoked. He has never used smokeless tobacco. He reports current alcohol use. He reports that he does not use drugs.  No Known Allergies  History reviewed. No pertinent family history.  Prior to Admission medications   Medication Sig Start Date End Date Taking? Authorizing Provider  ibuprofen (ADVIL) 800 MG tablet  Take 1 tablet (800 mg total) by mouth every eight (8) hours as needed for pain. 01/17/21     ondansetron (ZOFRAN ODT) 4 MG disintegrating tablet Take 1 tablet (4 mg total) by mouth every 8 (eight) hours as needed for nausea or vomiting. 07/29/19   Irean Hong, MD  oxyCODONE-acetaminophen (PERCOCET/ROXICET) 5-325 MG tablet Take 1 tablet by mouth every 4 (four) hours as needed for severe pain. 08/05/19   Stoioff, Verna Czech, MD  phentermine (ADIPEX-P) 37.5 MG tablet Take 1 tablet (37.5 mg total) by mouth daily before breakfast. 05/17/22     phentermine 37.5 MG capsule Take 1 capsule (37.5 mg total) by mouth every morning. 09/16/20     phentermine 37.5 MG capsule Take 1 capsule (37.5 mg total) by mouth every morning. 01/20/21     simvastatin (ZOCOR) 40 MG tablet Take 1 tablet (40 mg total) by mouth in the morning. 09/16/20     simvastatin (ZOCOR) 40 MG tablet TAKE 1 TABLET (40 MG TOTAL) BY MOUTH DAILY. 04/21/20 04/21/21  Lupita Dawn, MD  simvastatin (ZOCOR) 40 MG tablet Take 1 tablet (40 mg total) by mouth daily. 10/24/21     tamsulosin (FLOMAX) 0.4 MG CAPS capsule Take 1 capsule (0.4 mg total) by mouth daily. Patient not taking: Reported on 08/05/2019 07/29/19   Irean Hong, MD    Physical Exam: Vitals:   07/25/22 0846  BP: (!) 162/119  Pulse: 72  Resp: 18  Temp: 98 F (36.7 C)  SpO2: 98%  Weight: 106.6 kg  Height: 5\' 5"  (1.651  m)   Physical Exam Constitutional:      Appearance: He is obese.  HENT:     Head: Normocephalic and atraumatic.     Nose: Nose normal.     Mouth/Throat:     Mouth: Mucous membranes are moist.  Eyes:     Pupils: Pupils are equal, round, and reactive to light.  Cardiovascular:     Rate and Rhythm: Normal rate and regular rhythm.  Pulmonary:     Effort: Pulmonary effort is normal.  Abdominal:     General: Bowel sounds are normal.  Musculoskeletal:        General: Normal range of motion.  Skin:    General: Skin is warm.  Neurological:     General: No focal deficit  present.  Psychiatric:        Mood and Affect: Mood normal.     Data Reviewed:  There are no new results to review at this time. DG Chest 2 View CLINICAL DATA:  Chest pain.  EXAM: CHEST - 2 VIEW  COMPARISON:  None Available.  FINDINGS: Clear lungs. Normal heart size and mediastinal contours. No pleural effusion or pneumothorax. Visualized bones and upper abdomen are unremarkable.  IMPRESSION: No evidence of acute cardiopulmonary disease.  Electronically Signed   By: Orvan Falconer M.D.   On: 07/25/2022 09:23  Lab Results  Component Value Date   WBC 7.0 07/25/2022   HGB 14.5 07/25/2022   HCT 44.8 07/25/2022   MCV 91.4 07/25/2022   PLT 251 07/25/2022   Last metabolic panel Lab Results  Component Value Date   GLUCOSE 109 (H) 07/25/2022   NA 137 07/25/2022   K 4.4 07/25/2022   CL 107 07/25/2022   CO2 22 07/25/2022   BUN 11 07/25/2022   CREATININE 1.07 07/25/2022   GFRNONAA >60 07/25/2022   CALCIUM 9.9 07/25/2022   ANIONGAP 8 07/25/2022    Assessment and Plan: * NSTEMI (non-ST elevated myocardial infarction) [facility]) Patient with recurrent central chest pain since last night Recurrent chest pain with exertion as well as diaphoresis. Risk factors include obesity, hyperlipidemia Troponin in the 90s EKG fairly stable though with some?  Anterior ST changes Heart score around 6 Status post full dose aspirin Will reach out to cardiology for further recommendations Trend troponin Risk stratification labs Follow closely  Hyperlipidemia Check lipid panel High-dose statin  Obesity (BMI 30-39.9) BMI 39 Discussed diet and lifestyle modification Follow      Advance Care Planning:   Code Status: Full Code   Consults: Cardiology   Family Communication: No family at the bedside   Severity of Illness: The appropriate patient status for this patient is OBSERVATION. Observation status is judged to be reasonable and necessary in order to provide the required  intensity of service to ensure the patient's safety. The patient's presenting symptoms, physical exam findings, and initial radiographic and laboratory data in the context of their medical condition is felt to place them at decreased risk for further clinical deterioration. Furthermore, it is anticipated that the patient will be medically stable for discharge from the hospital within 2 midnights of admission.   Author: Floydene Flock, MD 07/25/2022 10:45 AM  For on call review [email or web address].

## 2022-07-25 NOTE — ED Notes (Signed)
Dr Alvester Morin notified verbally of critical trop

## 2022-07-25 NOTE — Progress Notes (Signed)
Troponin 95-->1 85 Will start on heparin drip Case discussed with Dr. Okey Dupre Will tentatively plan for cardiac catheterization tomorrow Clears now n.p.o. after midnight EKG pending Follow

## 2022-07-25 NOTE — Progress Notes (Signed)
PHARMACIST - PHYSICIAN COMMUNICATION  CONCERNING:  Enoxaparin (Lovenox) for DVT Prophylaxis    RECOMMENDATION: Patient was prescribed enoxaprin 40mg  q24 hours for VTE prophylaxis.   Filed Weights   07/25/22 0846  Weight: 106.6 kg (235 lb)    Body mass index is 39.11 kg/m.  Estimated Creatinine Clearance: 100.1 mL/min (by C-G formula based on SCr of 1.07 mg/dL).   Based on [facility] policy patient is candidate for enoxaparin 0.5mg /kg TBW SQ every 24 hours based on BMI being >30.   DESCRIPTION: Pharmacy has adjusted enoxaparin dose per [facility] policy.  Patient is now receiving enoxaparin 52 mg every 24 hours    Gardner Candle, PharmD, BCPS Clinical Pharmacist 07/25/2022 10:45 AM

## 2022-07-26 ENCOUNTER — Other Ambulatory Visit: Payer: Self-pay

## 2022-07-26 ENCOUNTER — Encounter
Admission: EM | Disposition: A | Discharge status: Home / Self Care | Payer: Self-pay | Source: Home / Self Care | Attending: Family Medicine

## 2022-07-26 DIAGNOSIS — R778 Other specified abnormalities of plasma proteins: Secondary | ICD-10-CM | POA: Diagnosis not present

## 2022-07-26 DIAGNOSIS — I214 Non-ST elevation (NSTEMI) myocardial infarction: Secondary | ICD-10-CM

## 2022-07-26 DIAGNOSIS — Z6839 Body mass index (BMI) 39.0-39.9, adult: Secondary | ICD-10-CM | POA: Diagnosis not present

## 2022-07-26 DIAGNOSIS — E669 Obesity, unspecified: Secondary | ICD-10-CM | POA: Diagnosis not present

## 2022-07-26 DIAGNOSIS — I2510 Atherosclerotic heart disease of native coronary artery without angina pectoris: Secondary | ICD-10-CM

## 2022-07-26 DIAGNOSIS — T463X5A Adverse effect of coronary vasodilators, initial encounter: Secondary | ICD-10-CM | POA: Diagnosis not present

## 2022-07-26 DIAGNOSIS — E785 Hyperlipidemia, unspecified: Secondary | ICD-10-CM | POA: Diagnosis not present

## 2022-07-26 DIAGNOSIS — Z79899 Other long term (current) drug therapy: Secondary | ICD-10-CM | POA: Diagnosis not present

## 2022-07-26 DIAGNOSIS — R079 Chest pain, unspecified: Secondary | ICD-10-CM | POA: Diagnosis not present

## 2022-07-26 DIAGNOSIS — G4440 Drug-induced headache, not elsewhere classified, not intractable: Secondary | ICD-10-CM | POA: Diagnosis not present

## 2022-07-26 DIAGNOSIS — E782 Mixed hyperlipidemia: Secondary | ICD-10-CM | POA: Diagnosis not present

## 2022-07-26 DIAGNOSIS — E7800 Pure hypercholesterolemia, unspecified: Secondary | ICD-10-CM | POA: Diagnosis not present

## 2022-07-26 DIAGNOSIS — Z87891 Personal history of nicotine dependence: Secondary | ICD-10-CM | POA: Diagnosis not present

## 2022-07-26 DIAGNOSIS — K219 Gastro-esophageal reflux disease without esophagitis: Secondary | ICD-10-CM | POA: Diagnosis not present

## 2022-07-26 HISTORY — PX: LEFT HEART CATH AND CORONARY ANGIOGRAPHY: CATH118249

## 2022-07-26 HISTORY — PX: CORONARY STENT INTERVENTION: CATH118234

## 2022-07-26 LAB — HEPARIN LEVEL (UNFRACTIONATED)
Heparin Unfractionated: 0.38 IU/mL (ref 0.30–0.70)
Heparin Unfractionated: 0.38 IU/mL (ref 0.30–0.70)

## 2022-07-26 LAB — CBC
HCT: 45.1 % (ref 39.0–52.0)
Hemoglobin: 14.9 g/dL (ref 13.0–17.0)
MCH: 29.7 pg (ref 26.0–34.0)
MCHC: 33 g/dL (ref 30.0–36.0)
MCV: 89.8 fL (ref 80.0–100.0)
Platelets: 227 10*3/uL (ref 150–400)
RBC: 5.02 MIL/uL (ref 4.22–5.81)
RDW: 12.7 % (ref 11.5–15.5)
WBC: 6.9 10*3/uL (ref 4.0–10.5)
nRBC: 0 % (ref 0.0–0.2)

## 2022-07-26 LAB — HIV ANTIBODY (ROUTINE TESTING W REFLEX): HIV Screen 4th Generation wRfx: NONREACTIVE

## 2022-07-26 LAB — ECHOCARDIOGRAM COMPLETE
AR max vel: 2 cm2
AV Area VTI: 2.09 cm2
AV Area mean vel: 1.87 cm2
AV Mean grad: 3.5 mmHg
AV Peak grad: 6.4 mmHg
Ao pk vel: 1.26 m/s
Area-P 1/2: 4.1 cm2
S' Lateral: 2.6 cm

## 2022-07-26 LAB — LIPID PANEL
Cholesterol: 218 mg/dL — ABNORMAL HIGH (ref 0–200)
HDL: 47 mg/dL (ref >40)
LDL Cholesterol: 131 mg/dL — ABNORMAL HIGH (ref 0–99)
Total CHOL/HDL Ratio: 4.6 RATIO
Triglycerides: 199 mg/dL — ABNORMAL HIGH (ref <150)
VLDL: 40 mg/dL (ref 0–40)

## 2022-07-26 LAB — POCT ACTIVATED CLOTTING TIME
Activated Clotting Time: 282 seconds
Activated Clotting Time: 293 seconds

## 2022-07-26 SURGERY — LEFT HEART CATH AND CORONARY ANGIOGRAPHY
Anesthesia: Moderate Sedation

## 2022-07-26 MED ORDER — HYDRALAZINE HCL 20 MG/ML IJ SOLN: 10.0000 mg | INTRAMUSCULAR | Status: AC | PRN

## 2022-07-26 MED ORDER — SODIUM CHLORIDE 0.9% FLUSH: 3.0000 mL | INTRAVENOUS | Status: DC | PRN

## 2022-07-26 MED ORDER — HEPARIN SODIUM (PORCINE) 1000 UNIT/ML IJ SOLN
INTRAMUSCULAR | Status: AC
  Filled 2022-07-26: qty 10

## 2022-07-26 MED ORDER — HEPARIN (PORCINE) IN NACL 1000-0.9 UT/500ML-% IV SOLN
INTRAVENOUS | Status: AC
  Filled 2022-07-26: qty 1000

## 2022-07-26 MED ORDER — IOHEXOL 300 MG/ML  SOLN
INTRAMUSCULAR | Status: DC | PRN
  Administered 2022-07-26: 111 mL

## 2022-07-26 MED ORDER — NITROGLYCERIN 1 MG/10 ML FOR IR/CATH LAB
INTRA_ARTERIAL | Status: DC | PRN
  Administered 2022-07-26: 100 ug via INTRACORONARY
  Administered 2022-07-26: 200 ug via INTRACORONARY

## 2022-07-26 MED ORDER — SODIUM CHLORIDE 0.9 % IV SOLN: 250.0000 mL | INTRAVENOUS | Status: DC | PRN

## 2022-07-26 MED ORDER — TICAGRELOR 90 MG PO TABS
ORAL_TABLET | ORAL | Status: DC | PRN
  Administered 2022-07-26: 180 mg via ORAL

## 2022-07-26 MED ORDER — SODIUM CHLORIDE 0.9% FLUSH: 3.0000 mL | Freq: Two times a day (BID) | INTRAVENOUS | Status: DC

## 2022-07-26 MED ORDER — TICAGRELOR 90 MG PO TABS
90.0000 mg | ORAL_TABLET | Freq: Two times a day (BID) | ORAL | Status: DC
  Administered 2022-07-26 – 2022-07-27 (×2): 90 mg via ORAL
  Filled 2022-07-26 (×2): qty 1

## 2022-07-26 MED ORDER — LIDOCAINE HCL 1 % IJ SOLN
INTRAMUSCULAR | Status: AC
  Filled 2022-07-26: qty 20

## 2022-07-26 MED ORDER — TICAGRELOR 90 MG PO TABS
ORAL_TABLET | ORAL | Status: AC
  Filled 2022-07-26: qty 2

## 2022-07-26 MED ORDER — MIDAZOLAM HCL 2 MG/2ML IJ SOLN
INTRAMUSCULAR | Status: DC | PRN
  Administered 2022-07-26 (×2): 1 mg via INTRAVENOUS

## 2022-07-26 MED ORDER — LIDOCAINE HCL (PF) 1 % IJ SOLN
INTRAMUSCULAR | Status: DC | PRN
  Administered 2022-07-26: 2 mL

## 2022-07-26 MED ORDER — HEPARIN SODIUM (PORCINE) 1000 UNIT/ML IJ SOLN
INTRAMUSCULAR | Status: DC | PRN
  Administered 2022-07-26: 6000 [IU] via INTRAVENOUS
  Administered 2022-07-26: 5000 [IU] via INTRAVENOUS
  Administered 2022-07-26: 3000 [IU] via INTRAVENOUS

## 2022-07-26 MED ORDER — SODIUM CHLORIDE 0.9 % IV SOLN: INTRAVENOUS | Status: DC

## 2022-07-26 MED ORDER — ENOXAPARIN SODIUM 60 MG/0.6ML IJ SOSY
50.0000 mg | PREFILLED_SYRINGE | INTRAMUSCULAR | Status: DC
  Filled 2022-07-26: qty 0.6

## 2022-07-26 MED ORDER — VERAPAMIL HCL 2.5 MG/ML IV SOLN
INTRAVENOUS | Status: DC | PRN
  Administered 2022-07-26 (×2): 2.5 mg via INTRA_ARTERIAL

## 2022-07-26 MED ORDER — SODIUM CHLORIDE 0.9% FLUSH
3.0000 mL | Freq: Two times a day (BID) | INTRAVENOUS | Status: DC
  Administered 2022-07-26 – 2022-07-27 (×2): 3 mL via INTRAVENOUS

## 2022-07-26 MED ORDER — FENTANYL CITRATE (PF) 100 MCG/2ML IJ SOLN
INTRAMUSCULAR | Status: AC
  Filled 2022-07-26: qty 2

## 2022-07-26 MED ORDER — NITROGLYCERIN 1 MG/10 ML FOR IR/CATH LAB
INTRA_ARTERIAL | Status: AC
  Filled 2022-07-26: qty 10

## 2022-07-26 MED ORDER — MIDAZOLAM HCL 2 MG/2ML IJ SOLN
INTRAMUSCULAR | Status: AC
  Filled 2022-07-26: qty 2

## 2022-07-26 MED ORDER — VERAPAMIL HCL 2.5 MG/ML IV SOLN
INTRAVENOUS | Status: AC
  Filled 2022-07-26: qty 2

## 2022-07-26 MED ORDER — ASPIRIN 81 MG PO CHEW: 81.0000 mg | CHEWABLE_TABLET | ORAL | Status: DC

## 2022-07-26 MED ORDER — FENTANYL CITRATE (PF) 100 MCG/2ML IJ SOLN
INTRAMUSCULAR | Status: DC | PRN
  Administered 2022-07-26: 50 ug via INTRAVENOUS
  Administered 2022-07-26: 25 ug via INTRAVENOUS

## 2022-07-26 MED ORDER — ASPIRIN 81 MG PO TBEC
81.0000 mg | DELAYED_RELEASE_TABLET | Freq: Every day | ORAL | Status: DC
  Administered 2022-07-27: 81 mg via ORAL
  Filled 2022-07-26: qty 1

## 2022-07-26 MED ORDER — LABETALOL HCL 5 MG/ML IV SOLN: 10.0000 mg | INTRAVENOUS | Status: AC | PRN

## 2022-07-26 MED ORDER — HEPARIN (PORCINE) IN NACL 1000-0.9 UT/500ML-% IV SOLN
INTRAVENOUS | Status: DC | PRN
  Administered 2022-07-26 (×2): 500 mL

## 2022-07-26 MED ORDER — SODIUM CHLORIDE 0.9 % IV SOLN: INTRAVENOUS | Status: AC

## 2022-07-26 SURGICAL SUPPLY — 22 items
BALLN EUPHORA RX 2.0X12 (BALLOONS) ×1
BALLN ~~LOC~~ TREK NEO RX 2.75X12 (BALLOONS) IMPLANT
BALLN ~~LOC~~ TREK NEO RX 2.75X8 (BALLOONS) IMPLANT
BALLOON EUPHORA RX 2.0X12 (BALLOONS) IMPLANT
CATH 5F 110X4 TIG (CATHETERS) IMPLANT
CATH INFINITI 5 FR JL3.5 (CATHETERS) IMPLANT
CATH INFINITI JR4 5F (CATHETERS) IMPLANT
CATH LAUNCHER 5F EBU3.0 (CATHETERS) IMPLANT
CATH VISTA GUIDE 6FR XBLAD3.5 (CATHETERS) IMPLANT
CATHETER LAUNCHER 5F EBU3.0 (CATHETERS) ×1
DEVICE RAD TR BAND REGULAR (VASCULAR PRODUCTS) IMPLANT
DRAPE BRACHIAL (DRAPES) IMPLANT
GLIDESHEATH SLEND SS 6F .021 (SHEATH) IMPLANT
GUIDEWIRE INQWIRE 1.5J.035X260 (WIRE) IMPLANT
INQWIRE 1.5J .035X260CM (WIRE) ×1
KIT ENCORE 26 ADVANTAGE (KITS) IMPLANT
PACK CARDIAC CATH (CUSTOM PROCEDURE TRAY) ×1 IMPLANT
PROTECTION STATION PRESSURIZED (MISCELLANEOUS) ×1
SET ATX-X65L (MISCELLANEOUS) IMPLANT
STATION PROTECTION PRESSURIZED (MISCELLANEOUS) IMPLANT
STENT ONYX FRONTIER 2.5X18 (Permanent Stent) IMPLANT
WIRE RUNTHROUGH .014X180CM (WIRE) IMPLANT

## 2022-07-26 NOTE — Hospital Course (Signed)
Kevin Clark isa  42 y.o. M with HLD, hyperlipidemia who presented with chest pain.  In the ER, troponins elevated, ECG unremarkable.  Started on heparin and admitted.

## 2022-07-26 NOTE — H&P (View-Only) (Signed)
 Rounding Note    Patient Name: Kevin Clark Date of Encounter: 07/26/2022  [facility] Cardiologist: New - Shenique Childers  Subjective   Feeling well this morning but experienced some mild chest discomfort walking to the bathroom yesterday afternoon.  No shortness of breath.  Right arm uncomfortable due to IV being in the antecubital fossa and making it difficult to bend at the elbow.  Nitroglycerin paste placed this morning but subsequently removed due to headache.  Inpatient Medications    Scheduled Meds:  aspirin EC  81 mg Oral Daily   atorvastatin  40 mg Oral Daily   nitroGLYCERIN  1 inch Topical Q6H   Continuous Infusions:  sodium chloride     heparin 1,400 Units/hr (07/26/22 0727)   PRN Meds: acetaminophen, ondansetron (ZOFRAN) IV   Vital Signs    Vitals:   07/25/22 1720 07/25/22 1900 07/26/22 0625 07/26/22 0722  BP: (!) 153/99 120/74  (!) 126/90  Pulse: 70 74  70  Resp: 20 18 18 18  Temp: 98 F (36.7 C) 98.2 F (36.8 C) 97.9 F (36.6 C) (!) 97.5 F (36.4 C)  TempSrc:   Oral Oral  SpO2: 98% 98% 95% 99%  Weight:      Height:        Intake/Output Summary (Last 24 hours) at 07/26/2022 0855 Last data filed at 07/26/2022 0600 Gross per 24 hour  Intake --  Output 875 ml  Net -875 ml      07/25/2022    8:46 AM 08/05/2019   10:59 AM 07/29/2019    1:12 AM  Last 3 Weights  Weight (lbs) 235 lb 235 lb 240 lb  Weight (kg) 106.595 kg 106.595 kg 108.863 kg      Telemetry    Normal sinus rhythm and sinus bradycardia - Personally Reviewed  ECG    No new tracing  Physical Exam   GEN: No acute distress.  Wife is at the bedside. Neck: No JVD Cardiac: RRR, no murmurs, rubs, or gallops.  Respiratory: Clear to auscultation bilaterally. GI: Soft, nontender, non-distended  MS: No edema; No deformity. Neuro:  Nonfocal  Psych: Normal affect   Labs    High Sensitivity Troponin:   Recent Labs  Lab 07/25/22 0848 07/25/22 1347 07/25/22 1720   TROPONINIHS 95* 185* 314*     Chemistry Recent Labs  Lab 07/25/22 0848  NA 137  K 4.4  CL 107  CO2 22  GLUCOSE 109*  BUN 11  CREATININE 1.07  CALCIUM 9.9  GFRNONAA >60  ANIONGAP 8    Lipids  Recent Labs  Lab 07/26/22 0706  CHOL 218*  TRIG 199*  HDL 47  LDLCALC 131*  CHOLHDL 4.6    Hematology Recent Labs  Lab 07/25/22 0848 07/26/22 0706  WBC 7.0 6.9  RBC 4.90 5.02  HGB 14.5 14.9  HCT 44.8 45.1  MCV 91.4 89.8  MCH 29.6 29.7  MCHC 32.4 33.0  RDW 12.6 12.7  PLT 251 227   Thyroid No results for input(s): "TSH", "FREET4" in the last 168 hours.  BNPNo results for input(s): "BNP", "PROBNP" in the last 168 hours.  DDimer No results for input(s): "DDIMER" in the last 168 hours.   Radiology    ECHOCARDIOGRAM COMPLETE  Result Date: 07/26/2022    ECHOCARDIOGRAM REPORT   Patient Name:   Kevin Clark Date of Exam: 07/25/2022 Medical Rec #:  [id number]         Height:       65.0 in   Accession #:    [id number]        Weight:       235.0 lb Date of Birth:  05/25/1980         BSA:          2.118 m Patient Age:    41 years          BP:           128/88 mmHg Patient Gender: M                 HR:           62 bpm. Exam Location:  [facility] Procedure: 2D Echo, Cardiac Doppler, Color Doppler and Intracardiac            Opacification Agent Indications:     R07.9 Chest pain  History:         Patient has no prior history of Echocardiogram examinations.                  Risk Factors:Dyslipidemia. Palpitations.  Sonographer:     NaTashia Rodgers-Jones RDCS Referring Phys:  [id number] SHERI HAMMOCK Diagnosing Phys: Erandi Lemma MD IMPRESSIONS  1. Left ventricular ejection fraction, by estimation, is 55 to 60%. The left ventricle has normal function. The left ventricle has no regional wall motion abnormalities. Left ventricular diastolic parameters were normal.  2. Right ventricular systolic function is normal. The right ventricular size is normal.  3. The mitral valve is normal in structure.  Trivial mitral valve regurgitation. No evidence of mitral stenosis.  4. The aortic valve is tricuspid. Aortic valve regurgitation is not visualized. No aortic stenosis is present.  5. The inferior vena cava is normal in size with greater than 50% respiratory variability, suggesting right atrial pressure of 3 mmHg. FINDINGS  Left Ventricle: Left ventricular ejection fraction, by estimation, is 55 to 60%. The left ventricle has normal function. The left ventricle has no regional wall motion abnormalities. Definity contrast agent was given IV to delineate the left ventricular  endocardial borders. The left ventricular internal cavity size was normal in size. There is no left ventricular hypertrophy. Left ventricular diastolic parameters were normal. Right Ventricle: The right ventricular size is normal. No increase in right ventricular wall thickness. Right ventricular systolic function is normal. Left Atrium: Left atrial size was normal in size. Right Atrium: Right atrial size was normal in size. Pericardium: There is no evidence of pericardial effusion. Mitral Valve: The mitral valve is normal in structure. Trivial mitral valve regurgitation. No evidence of mitral valve stenosis. Tricuspid Valve: The tricuspid valve is normal in structure. Tricuspid valve regurgitation is trivial. Aortic Valve: The aortic valve is tricuspid. Aortic valve regurgitation is not visualized. No aortic stenosis is present. Aortic valve mean gradient measures 3.5 mmHg. Aortic valve peak gradient measures 6.4 mmHg. Aortic valve area, by VTI measures 2.09 cm. Pulmonic Valve: The pulmonic valve was normal in structure. Pulmonic valve regurgitation is not visualized. No evidence of pulmonic stenosis. Aorta: The aortic root and ascending aorta are structurally normal, with no evidence of dilitation. Pulmonary Artery: The pulmonary artery is of normal size. Venous: The inferior vena cava is normal in size with greater than 50% respiratory  variability, suggesting right atrial pressure of 3 mmHg. IAS/Shunts: No atrial level shunt detected by color flow Doppler.  LEFT VENTRICLE PLAX 2D LVIDd:         4.20 cm   Diastology LVIDs:         2.60 cm     LV e' medial:    10.09 cm/s LV PW:         1.00 cm   LV E/e' medial:  9.8 LV IVS:        1.00 cm   LV e' lateral:   12.10 cm/s LVOT diam:     2.00 cm   LV E/e' lateral: 8.2 LV SV:         57 LV SV Index:   27 LVOT Area:     3.14 cm  RIGHT VENTRICLE             IVC RV Basal diam:  4.00 cm     IVC diam: 1.30 cm RV S prime:     12.85 cm/s TAPSE (M-mode): 1.6 cm LEFT ATRIUM             Index        RIGHT ATRIUM           Index LA diam:        4.60 cm 2.17 cm/m   RA Area:     13.90 cm LA Vol (A2C):   44.4 ml 20.96 ml/m  RA Volume:   34.90 ml  16.47 ml/m LA Vol (A4C):   53.4 ml 25.21 ml/m LA Biplane Vol: 50.5 ml 23.84 ml/m  AORTIC VALVE AV Area (Vmax):    2.00 cm AV Area (Vmean):   1.87 cm AV Area (VTI):     2.09 cm AV Vmax:           126.05 cm/s AV Vmean:          89.393 cm/s AV VTI:            0.271 m AV Peak Grad:      6.4 mmHg AV Mean Grad:      3.5 mmHg LVOT Vmax:         80.05 cm/s LVOT Vmean:        53.150 cm/s LVOT VTI:          0.180 m LVOT/AV VTI ratio: 0.67  AORTA Ao Root diam: 3.20 cm Ao Asc diam:  2.90 cm MITRAL VALVE MV Area (PHT): 4.10 cm    SHUNTS MV Decel Time: 185 msec    Systemic VTI:  0.18 m MV E velocity: 99.30 cm/s  Systemic Diam: 2.00 cm MV A velocity: 87.25 cm/s MV E/A ratio:  1.14 Havoc Sanluis MD Electronically signed by Kayla Deshaies MD Signature Date/Time: 07/26/2022/6:32:54 AM    Final    DG Chest 2 View  Result Date: 07/25/2022 CLINICAL DATA:  Chest pain. EXAM: CHEST - 2 VIEW COMPARISON:  None Available. FINDINGS: Clear lungs. Normal heart size and mediastinal contours. No pleural effusion or pneumothorax. Visualized bones and upper abdomen are unremarkable. IMPRESSION: No evidence of acute cardiopulmonary disease. Electronically Signed   By: Walter  Wiggins M.D.   On:  07/25/2022 09:23    Cardiac Studies   See echocardiogram above  Patient Profile     41 y.o. male with history of hyperlipidemia, obesity, and prior tobacco use, admitted with NSTEMI.  Assessment & Plan    NSTEMI: Mr. Kenton continues to have chest discomfort with mild activity.  Troponin has gradually trended up, most recently 314 on last check yesterday evening. -Continue aspirin and heparin. -Continue atorvastatin 40 mg daily. -Discontinue nitroglycerin paste due to headache and lack of ongoing chest pain. -Plan for left heart catheterization with possible PCI later today.  Procedure will be this afternoon; patient can have clear liquids until   noon.  Hyperlipidemia: Lipid suboptimally controlled, previously on simvastatin. -Agree with transition to atorvastatin; will increase dose to 80 mg daily.  Shared Decision Making/Informed Consent The risks [stroke (1 in 1000), death (1 in 1000), kidney failure [usually temporary] (1 in 500), bleeding (1 in 200), allergic reaction [possibly serious] (1 in 200)], benefits (diagnostic support and management of coronary artery disease) and alternatives of a cardiac catheterization were discussed in detail with Mr. Mungo and he is willing to proceed.  For questions or updates, please contact [facility] Please consult [email or web address] for contact info under [facility].    Signed, Sundae Maners, MD  07/26/2022, 8:55 AM    

## 2022-07-26 NOTE — Interval H&P Note (Signed)
History and Physical Interval Note:  07/26/2022 3:16 PM  [name]  has presented today for surgery, with the diagnosis of NSTEMI.  The various methods of treatment have been discussed with the patient and family. After consideration of risks, benefits and other options for treatment, the patient has consented to  Procedure(s): LEFT HEART CATH AND CORONARY ANGIOGRAPHY (N/A) as a surgical intervention.  The patient's history has been reviewed, patient examined, no change in status, stable for surgery.  I have reviewed the patient's chart and labs.  Questions were answered to the patient's satisfaction.    Cath Lab Visit (complete for each Cath Lab visit)  Clinical Evaluation Leading to the Procedure:   ACS: Yes.    Non-ACS:  N/A  Kevin Clark

## 2022-07-26 NOTE — Assessment & Plan Note (Signed)
BMI 39 

## 2022-07-26 NOTE — Progress Notes (Signed)
ANTICOAGULATION CONSULT NOTE  Pharmacy Consult for IV heparin Indication: chest pain/ACS  No Known Allergies  Patient Measurements: Height: 5\' 5"  (165.1 cm) Weight: 106.6 kg (235 lb) IBW/kg (Calculated) : 61.5 Heparin Dosing Weight: 85.8 kg  Vital Signs: Temp: 97.5 F (36.4 C) (05/15 0722) Temp Source: Oral (05/15 0722) BP: 126/90 (05/15 0722) Pulse Rate: 70 (05/15 0722)  Labs: Recent Labs    07/25/22 0848 07/25/22 1347 07/25/22 1511 07/25/22 1720 07/25/22 2139 07/26/22 0706  HGB 14.5  --   --   --   --  14.9  HCT 44.8  --   --   --   --  45.1  PLT 251  --   --   --   --  227  APTT  --   --  27  --   --   --   LABPROT  --   --  13.8  --   --   --   INR  --   --  1.0  --   --   --   HEPARINUNFRC  --   --   --   --  0.26* 0.38  CREATININE 1.07  --   --   --   --   --   TROPONINIHS 95* 185*  --  314*  --   --      Estimated Creatinine Clearance: 100.1 mL/min (by C-G formula based on SCr of 1.07 mg/dL).   Medical History: Past Medical History:  Diagnosis Date   Hyperlipidemia    Obesity    Palpitations     Medications:  No anticoagulation prior to admission  Assessment: 42 year old male presenting with chest pain. Troponins trending up on repeat.  Baseline H&H stable. Baseline aPTT and PT-INR ordered.  Goal of Therapy:  Heparin level 0.3-0.7 units/ml Monitor platelets by anticoagulation protocol: Yes   05/14 2139 HL 0.26, subtherapeutic 05/15 0706 HL 0.38, therapeutic x 1  Plan:  Heparin level therapeutic x 1 Continue heparin infusion at 1400 units/hr Recheck HL in 6 hours to confirm CBC daily while on heparin  [employer]" Jasper Riling, PharmD 07/26/2022 8:16 AM

## 2022-07-26 NOTE — Assessment & Plan Note (Signed)
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

## 2022-07-26 NOTE — Progress Notes (Signed)
Rounding Note    Patient Name: Kevin Clark Date of Encounter: 07/26/2022  [facility] Cardiologist: New - Naseem Adler  Subjective   Feeling well this morning but experienced some mild chest discomfort walking to the bathroom yesterday afternoon.  No shortness of breath.  Right arm uncomfortable due to IV being in the antecubital fossa and making it difficult to bend at the elbow.  Nitroglycerin paste placed this morning but subsequently removed due to headache.  Inpatient Medications    Scheduled Meds:  aspirin EC  81 mg Oral Daily   atorvastatin  40 mg Oral Daily   nitroGLYCERIN  1 inch Topical Q6H   Continuous Infusions:  sodium chloride     heparin 1,400 Units/hr (07/26/22 0727)   PRN Meds: acetaminophen, ondansetron (ZOFRAN) IV   Vital Signs    Vitals:   07/25/22 1720 07/25/22 1900 07/26/22 0625 07/26/22 0722  BP: (!) 153/99 120/74  (!) 126/90  Pulse: 70 74  70  Resp: 20 18 18 18   Temp: 98 F (36.7 C) 98.2 F (36.8 C) 97.9 F (36.6 C) (!) 97.5 F (36.4 C)  TempSrc:   Oral Oral  SpO2: 98% 98% 95% 99%  Weight:      Height:        Intake/Output Summary (Last 24 hours) at 07/26/2022 0855 Last data filed at 07/26/2022 0600 Gross per 24 hour  Intake --  Output 875 ml  Net -875 ml      07/25/2022    8:46 AM 08/05/2019   10:59 AM 07/29/2019    1:12 AM  Last 3 Weights  Weight (lbs) 235 lb 235 lb 240 lb  Weight (kg) 106.595 kg 106.595 kg 108.863 kg      Telemetry    Normal sinus rhythm and sinus bradycardia - Personally Reviewed  ECG    No new tracing  Physical Exam   GEN: No acute distress.  Wife is at the bedside. Neck: No JVD Cardiac: RRR, no murmurs, rubs, or gallops.  Respiratory: Clear to auscultation bilaterally. GI: Soft, nontender, non-distended  MS: No edema; No deformity. Neuro:  Nonfocal  Psych: Normal affect   Labs    High Sensitivity Troponin:   Recent Labs  Lab 07/25/22 0848 07/25/22 1347 07/25/22 1720   TROPONINIHS 95* 185* 314*     Chemistry Recent Labs  Lab 07/25/22 0848  NA 137  K 4.4  CL 107  CO2 22  GLUCOSE 109*  BUN 11  CREATININE 1.07  CALCIUM 9.9  GFRNONAA >60  ANIONGAP 8    Lipids  Recent Labs  Lab 07/26/22 0706  CHOL 218*  TRIG 199*  HDL 47  LDLCALC 131*  CHOLHDL 4.6    Hematology Recent Labs  Lab 07/25/22 0848 07/26/22 0706  WBC 7.0 6.9  RBC 4.90 5.02  HGB 14.5 14.9  HCT 44.8 45.1  MCV 91.4 89.8  MCH 29.6 29.7  MCHC 32.4 33.0  RDW 12.6 12.7  PLT 251 227   Thyroid No results for input(s): "TSH", "FREET4" in the last 168 hours.  BNPNo results for input(s): "BNP", "PROBNP" in the last 168 hours.  DDimer No results for input(s): "DDIMER" in the last 168 hours.   Radiology    ECHOCARDIOGRAM COMPLETE  Result Date: 07/26/2022    ECHOCARDIOGRAM REPORT   Patient Name:   [facility] Date of Exam: 07/25/2022 Medical Rec #:  [id number]         Height:       65.0 in  Accession #:    [id number]        Weight:       235.0 lb Date of Birth:  February 23, 1981         BSA:          2.118 m Patient Age:    41 years          BP:           128/88 mmHg Patient Gender: M                 HR:           62 bpm. Exam Location:  [facility] Procedure: 2D Echo, Cardiac Doppler, Color Doppler and Intracardiac            Opacification Agent Indications:     R07.9 Chest pain  History:         Patient has no prior history of Echocardiogram examinations.                  Risk Factors:Dyslipidemia. Palpitations.  Sonographer:     Macalister Sirman RDCS Referring Phys:  [id number] SHERI HAMMOCK Diagnosing Phys: Yvonne Kendall MD IMPRESSIONS  1. Left ventricular ejection fraction, by estimation, is 55 to 60%. The left ventricle has normal function. The left ventricle has no regional wall motion abnormalities. Left ventricular diastolic parameters were normal.  2. Right ventricular systolic function is normal. The right ventricular size is normal.  3. The mitral valve is normal in structure.  Trivial mitral valve regurgitation. No evidence of mitral stenosis.  4. The aortic valve is tricuspid. Aortic valve regurgitation is not visualized. No aortic stenosis is present.  5. The inferior vena cava is normal in size with greater than 50% respiratory variability, suggesting right atrial pressure of 3 mmHg. FINDINGS  Left Ventricle: Left ventricular ejection fraction, by estimation, is 55 to 60%. The left ventricle has normal function. The left ventricle has no regional wall motion abnormalities. Definity contrast agent was given IV to delineate the left ventricular  endocardial borders. The left ventricular internal cavity size was normal in size. There is no left ventricular hypertrophy. Left ventricular diastolic parameters were normal. Right Ventricle: The right ventricular size is normal. No increase in right ventricular wall thickness. Right ventricular systolic function is normal. Left Atrium: Left atrial size was normal in size. Right Atrium: Right atrial size was normal in size. Pericardium: There is no evidence of pericardial effusion. Mitral Valve: The mitral valve is normal in structure. Trivial mitral valve regurgitation. No evidence of mitral valve stenosis. Tricuspid Valve: The tricuspid valve is normal in structure. Tricuspid valve regurgitation is trivial. Aortic Valve: The aortic valve is tricuspid. Aortic valve regurgitation is not visualized. No aortic stenosis is present. Aortic valve mean gradient measures 3.5 mmHg. Aortic valve peak gradient measures 6.4 mmHg. Aortic valve area, by VTI measures 2.09 cm. Pulmonic Valve: The pulmonic valve was normal in structure. Pulmonic valve regurgitation is not visualized. No evidence of pulmonic stenosis. Aorta: The aortic root and ascending aorta are structurally normal, with no evidence of dilitation. Pulmonary Artery: The pulmonary artery is of normal size. Venous: The inferior vena cava is normal in size with greater than 50% respiratory  variability, suggesting right atrial pressure of 3 mmHg. IAS/Shunts: No atrial level shunt detected by color flow Doppler.  LEFT VENTRICLE PLAX 2D LVIDd:         4.20 cm   Diastology LVIDs:         2.60 cm  LV e' medial:    10.09 cm/s LV PW:         1.00 cm   LV E/e' medial:  9.8 LV IVS:        1.00 cm   LV e' lateral:   12.10 cm/s LVOT diam:     2.00 cm   LV E/e' lateral: 8.2 LV SV:         57 LV SV Index:   27 LVOT Area:     3.14 cm  RIGHT VENTRICLE             IVC RV Basal diam:  4.00 cm     IVC diam: 1.30 cm RV S prime:     12.85 cm/s TAPSE (M-mode): 1.6 cm LEFT ATRIUM             Index        RIGHT ATRIUM           Index LA diam:        4.60 cm 2.17 cm/m   RA Area:     13.90 cm LA Vol (A2C):   44.4 ml 20.96 ml/m  RA Volume:   34.90 ml  16.47 ml/m LA Vol (A4C):   53.4 ml 25.21 ml/m LA Biplane Vol: 50.5 ml 23.84 ml/m  AORTIC VALVE AV Area (Vmax):    2.00 cm AV Area (Vmean):   1.87 cm AV Area (VTI):     2.09 cm AV Vmax:           126.05 cm/s AV Vmean:          89.393 cm/s AV VTI:            0.271 m AV Peak Grad:      6.4 mmHg AV Mean Grad:      3.5 mmHg LVOT Vmax:         80.05 cm/s LVOT Vmean:        53.150 cm/s LVOT VTI:          0.180 m LVOT/AV VTI ratio: 0.67  AORTA Ao Root diam: 3.20 cm Ao Asc diam:  2.90 cm MITRAL VALVE MV Area (PHT): 4.10 cm    SHUNTS MV Decel Time: 185 msec    Systemic VTI:  0.18 m MV E velocity: 99.30 cm/s  Systemic Diam: 2.00 cm MV A velocity: 87.25 cm/s MV E/A ratio:  1.14 Kevin Deer Kita Neace MD Electronically signed by Yvonne Kendall MD Signature Date/Time: 07/26/2022/6:32:54 AM    Final    DG Chest 2 View  Result Date: 07/25/2022 CLINICAL DATA:  Chest pain. EXAM: CHEST - 2 VIEW COMPARISON:  None Available. FINDINGS: Clear lungs. Normal heart size and mediastinal contours. No pleural effusion or pneumothorax. Visualized bones and upper abdomen are unremarkable. IMPRESSION: No evidence of acute cardiopulmonary disease. Electronically Signed   By: Orvan Falconer M.D.   On:  07/25/2022 09:23    Cardiac Studies   See echocardiogram above  Patient Profile     42 y.o. male with history of hyperlipidemia, obesity, and prior tobacco use, admitted with NSTEMI.  Assessment & Plan    NSTEMI: Mr. Oramas continues to have chest discomfort with mild activity.  Troponin has gradually trended up, most recently 314 on last check yesterday evening. -Continue aspirin and heparin. -Continue atorvastatin 40 mg daily. -Discontinue nitroglycerin paste due to headache and lack of ongoing chest pain. -Plan for left heart catheterization with possible PCI later today.  Procedure will be this afternoon; patient can have clear liquids until  noon.  Hyperlipidemia: Lipid suboptimally controlled, previously on simvastatin. -Agree with transition to atorvastatin; will increase dose to 80 mg daily.  Shared Decision Making/Informed Consent The risks [stroke (1 in 1000), death (1 in 1000), kidney failure [usually temporary] (1 in 500), bleeding (1 in 200), allergic reaction [possibly serious] (1 in 200)], benefits (diagnostic support and management of coronary artery disease) and alternatives of a cardiac catheterization were discussed in detail with Mr. Worthington and he is willing to proceed.  For questions or updates, please contact [facility] Please consult [email or web address] for contact info under [facility].    Signed, Yvonne Kendall, MD  07/26/2022, 8:55 AM

## 2022-07-26 NOTE — Assessment & Plan Note (Signed)
LDL 131 - Stop home simvastatin - Start atorvastatin 40

## 2022-07-26 NOTE — Progress Notes (Signed)
  Progress Note   Patient: Kevin Clark UJW:[id number] DOB: [birth year] DOA: 07/25/2022     0 DOS: the patient was seen and examined on 07/26/2022 at 10:48AM      Brief hospital course: Mr. Barco isa  42 y.o. M with HLD, hyperlipidemia who presented with chest pain.  In the ER, troponins elevated, ECG unremarkable.  Started on heparin and admitted.     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  Has a headache from nitro.  No other complaints, no nursing concerns.     Physical Exam: BP (!) 132/93   Pulse 76   Temp 98.4 F (36.9 C) (Oral)   Resp 20   Ht 5\' 5"  (1.651 m)   Wt 106.6 kg   SpO2 97%   BMI 39.11 kg/m   Adult male, lying in bed, no acute distress, conversational RRR no murmurs, no LE edema RR normal Abdomen soft w/o TTP, guarding Attention normal, affect normal, oriented and interactive, moves all extremities normally     Data Reviewed: Left heart cath report reviewed and summarized above BMP normal CBC normal  Family Communication: Wife at bedside    Disposition: Status is: Inpatient Stent today, will observe overnight, if stable tomorrow, will d/c when cleared by Cardiology         Author: Alberteen Sam, MD 07/26/2022 5:15 PM  For on call review [email or web address].

## 2022-07-27 ENCOUNTER — Telehealth (HOSPITAL_COMMUNITY): Payer: Self-pay | Admitting: Pharmacy Technician

## 2022-07-27 ENCOUNTER — Encounter: Payer: Self-pay | Admitting: Internal Medicine

## 2022-07-27 ENCOUNTER — Other Ambulatory Visit: Payer: Self-pay

## 2022-07-27 ENCOUNTER — Other Ambulatory Visit (HOSPITAL_COMMUNITY): Payer: Self-pay

## 2022-07-27 DIAGNOSIS — I214 Non-ST elevation (NSTEMI) myocardial infarction: Secondary | ICD-10-CM | POA: Diagnosis not present

## 2022-07-27 DIAGNOSIS — E669 Obesity, unspecified: Secondary | ICD-10-CM | POA: Diagnosis not present

## 2022-07-27 DIAGNOSIS — R079 Chest pain, unspecified: Secondary | ICD-10-CM

## 2022-07-27 DIAGNOSIS — E782 Mixed hyperlipidemia: Secondary | ICD-10-CM | POA: Diagnosis not present

## 2022-07-27 LAB — BASIC METABOLIC PANEL
Anion gap: 8 (ref 5–15)
BUN: 15 mg/dL (ref 6–20)
CO2: 23 mmol/L (ref 22–32)
Calcium: 8.6 mg/dL — ABNORMAL LOW (ref 8.9–10.3)
Chloride: 106 mmol/L (ref 98–111)
Creatinine, Ser: 0.88 mg/dL (ref 0.61–1.24)
GFR, Estimated: >60 mL/min (ref >60)
Glucose, Bld: 102 mg/dL — ABNORMAL HIGH (ref 70–99)
Potassium: 4.1 mmol/L (ref 3.5–5.1)
Sodium: 137 mmol/L (ref 135–145)

## 2022-07-27 LAB — CBC
HCT: 41.8 % (ref 39.0–52.0)
Hemoglobin: 13.7 g/dL (ref 13.0–17.0)
MCH: 29.7 pg (ref 26.0–34.0)
MCHC: 32.8 g/dL (ref 30.0–36.0)
MCV: 90.7 fL (ref 80.0–100.0)
Platelets: 219 10*3/uL (ref 150–400)
RBC: 4.61 MIL/uL (ref 4.22–5.81)
RDW: 12.6 % (ref 11.5–15.5)
WBC: 7.4 10*3/uL (ref 4.0–10.5)
nRBC: 0 % (ref 0.0–0.2)

## 2022-07-27 MED ORDER — ASPIRIN 81 MG PO TBEC
81.0000 mg | DELAYED_RELEASE_TABLET | Freq: Every day | ORAL | 12 refills | Status: DC
  Filled 2022-07-27: qty 30, 30d supply, fill #0
  Filled 2022-08-16 – 2022-10-13 (×2): qty 30, 30d supply, fill #1
  Filled 2022-12-05: qty 30, 30d supply, fill #2
  Filled 2023-01-03: qty 30, 30d supply, fill #3
  Filled 2023-02-11: qty 30, 30d supply, fill #4
  Filled 2023-03-10: qty 30, 30d supply, fill #5
  Filled 2023-04-09: qty 30, 30d supply, fill #6
  Filled 2023-05-09: qty 30, 30d supply, fill #7
  Filled 2023-06-06: qty 30, 30d supply, fill #8
  Filled 2023-07-09: qty 30, 30d supply, fill #9

## 2022-07-27 MED ORDER — METOPROLOL SUCCINATE ER 25 MG PO TB24: 25.0000 mg | ORAL_TABLET | Freq: Every day | ORAL | Status: DC

## 2022-07-27 MED ORDER — METOPROLOL SUCCINATE ER 25 MG PO TB24
25.0000 mg | ORAL_TABLET | Freq: Every day | ORAL | 3 refills | Status: DC
  Filled 2022-07-27: qty 30, 30d supply, fill #0

## 2022-07-27 MED ORDER — ATORVASTATIN CALCIUM 80 MG PO TABS: 80.0000 mg | ORAL_TABLET | Freq: Every day | ORAL | Status: DC

## 2022-07-27 MED ORDER — ATORVASTATIN CALCIUM 80 MG PO TABS
80.0000 mg | ORAL_TABLET | Freq: Every day | ORAL | 3 refills | Status: DC
  Filled 2022-07-27: qty 30, 30d supply, fill #0

## 2022-07-27 MED ORDER — TICAGRELOR 90 MG PO TABS
90.0000 mg | ORAL_TABLET | Freq: Two times a day (BID) | ORAL | 3 refills | Status: DC
  Filled 2022-07-27: qty 60, 30d supply, fill #0

## 2022-07-27 NOTE — Discharge Summary (Signed)
Physician Discharge Summary   Patient: Kevin Clark MRN: [id number] DOB: [birth year]  Admit date:     07/25/2022  Discharge date: 07/27/22  Discharge Physician: Alberteen Sam   PCP: Lupita Dawn, MD     Recommendations at discharge:  Follow up with Cardiology for NSTEMI Follow up with PCP for coronary artery disease     Discharge Diagnoses: Principal Problem:   NSTEMI (non-ST elevated myocardial infarction) (HCC) Active Problems:   Hyperlipidemia   Obesity (BMI 30-39.9)     Hospital Course: Kevin Clark isa  42 y.o. M with HLD, hyperlipidemia who presented with chest pain.  In the ER, troponins elevated, ECG unremarkable.  Started on heparin and admitted.      * NSTEMI (non-ST elevated myocardial infarction) [facility]) Patient admitted on heparin, cardiology were consulted who recommended left heart cath.  Taken to the Cath Lab where he was found to have critical stenosis of inferior branch of OM1, treated with DES.  Echocardiogram was obtained that showed preserved EF.  He was discharged on aspirin, Brilinta, atorvastatin 80, metoprolol.  ACE inhibitor and spironolactone are indicated in the setting of preserved EF.  Referral to cardiac rehab made.    Cardiology follow-up arranged           The [facility] Controlled Substances Registry was reviewed for this patient prior to discharge.  Consultants: Cardiology Procedures performed:  -Echocardiogram - Left heart catheterization  Disposition: Home Diet recommendation:  Discharge Diet Orders (From admission, onward)     Start     Ordered   07/27/22 0000  Diet - low sodium heart healthy        07/27/22 1116             DISCHARGE MEDICATION: Allergies as of 07/27/2022   No Known Allergies      Medication List     STOP taking these medications    doxycycline 100 MG EC tablet Commonly known as: DORYX   phentermine 37.5 MG tablet Commonly known as: ADIPEX-P   simvastatin 40  MG tablet Commonly known as: ZOCOR       TAKE these medications    aspirin EC 81 MG tablet Take 1 tablet (81 mg total) by mouth daily. Swallow whole. Start taking on: Jul 28, 2022   atorvastatin 80 MG tablet Commonly known as: LIPITOR Take 1 tablet (80 mg total) by mouth daily. Start taking on: Jul 28, 2022   Brilinta 90 MG Tabs tablet Generic drug: ticagrelor Take 1 tablet (90 mg total) by mouth 2 (two) times daily.   metoprolol succinate 25 MG 24 hr tablet Commonly known as: TOPROL-XL Take 1 tablet (25 mg total) by mouth daily.        Follow-up Information     Lupita Dawn, MD. Schedule an appointment as soon as possible for a visit in 1 week(s).   Specialty: Family Medicine        Antonieta Iba, MD. Schedule an appointment as soon as possible for a visit.   Specialty: Cardiology Contact information: [address] [place] [address] [phone]                 Discharge Instructions     AMB Referral to Cardiac Rehabilitation - Phase II   Complete by: As directed    Diagnosis:  Coronary Stents NSTEMI     After initial evaluation and assessments completed: Virtual Based Care may be provided alone or in conjunction with Phase 2 Cardiac Rehab  based on patient barriers.: Yes   Intensive Cardiac Rehabilitation (ICR) [facility] location only OR Traditional Cardiac Rehabilitation (TCR) *If criteria for ICR are not met will enroll in TCR [facility] only): Yes   Diet - low sodium heart healthy   Complete by: As directed    Discharge instructions   Complete by: As directed    **IMPORTANT DISCHARGE INSTRUCTIONS**   From Dr. Maryfrances Bunnell: You were admitted for chest pain.  Here, we found that this was from a heart attack.  This was determined by performing a heart catheterization and finding a blood vessel (specifically the OM1 inferior branch) that was almost completely blocked.  You had a stent placed.  You MUST take aspirin 81 mg daily and  Brilinta twice daily These are "anti-platelet" medicines, that "grease" platelets so they don't stick together (platelets are the cells in your circulation that start clotting)  We have also CHANGED your simvastatin to atorvastatin 80 mg nightly for cholesterol control Have Dr. Denyse Amass or your new heart doctor check your liver function tests and cholesterol in 6-12 weeks to follow up  Lastly, we have started a heart protective medicine, metoprolol Some people think this is a blood pressure medicine, but for you, it is not for blood pressure control but for preventing the adrenaline system from damaging the heart  Take this once daily  Go see Dr. Denyse Amass in about a week Go see the Heart doctors as they schedule you  Go to Cardiac Rehab as recommended so that you know when to increase physical activity safely   Increase activity slowly   Complete by: As directed        Discharge Exam: Filed Weights   07/25/22 0846 07/26/22 1358  Weight: 106.6 kg 106.6 kg    General: Pt is alert, awake, not in acute distress Cardiovascular: RRR, nl S1-S2, no murmurs appreciated.   No LE edema.   Respiratory: Normal respiratory rate and rhythm.  CTAB without rales or wheezes. Abdominal: Abdomen soft and non-tender.  No distension or HSM.   Neuro/Psych: Strength symmetric in upper and lower extremities.  Judgment and insight appear normal.   Condition at discharge: good  The results of significant diagnostics from this hospitalization (including imaging, microbiology, ancillary and laboratory) are listed below for reference.   Imaging Studies: CARDIAC CATHETERIZATION  Result Date: 07/26/2022 Conclusions: Severe single-vessel coronary artery disease with thrombotic 99% stenosis of inferior branch of OM1.  There is also 50-60% mid LAD disease and 20-30% stenosis of the mid/distal LMCA. Normal left ventricular filling pressure. Successful PCI to inferior branch of OM1 using Onyx Frontier 2.5 x 18 mm  drug-eluting stent (postdilated to 2.8 mm) with 0% residual stenosis and TIMI-3 flow. Recommendations: Dual antiplatelet therapy with aspirin and ticagrelor for at least 12 months. Aggressive secondary prevention of coronary artery disease. Medical therapy of moderate LAD disease.  If the patient has recurrent chest pain, functional assessment to determine hemodynamic significance of LAD stenosis should be considered. Kevin Kendall, MD [facility]   ECHOCARDIOGRAM COMPLETE  Result Date: 07/26/2022    ECHOCARDIOGRAM REPORT   Patient Name:   Kevin Clark Date of Exam: 07/25/2022 Medical Rec #:  [id number]         Height:       65.0 in Accession #:    [id number]        Weight:       235.0 lb Date of Birth:  Nov 21, 1980         BSA:  2.118 m Patient Age:    41 years          BP:           128/88 mmHg Patient Gender: M                 HR:           62 bpm. Exam Location:  [facility] Procedure: 2D Echo, Cardiac Doppler, Color Doppler and Intracardiac            Opacification Agent Indications:     R07.9 Chest pain  History:         Patient has no prior history of Echocardiogram examinations.                  Risk Factors:Dyslipidemia. Palpitations.  Sonographer:     Helix Patterson RDCS Referring Phys:  [id number] SHERI HAMMOCK Diagnosing Phys: Kevin Kendall MD IMPRESSIONS  1. Left ventricular ejection fraction, by estimation, is 55 to 60%. The left ventricle has normal function. The left ventricle has no regional wall motion abnormalities. Left ventricular diastolic parameters were normal.  2. Right ventricular systolic function is normal. The right ventricular size is normal.  3. The mitral valve is normal in structure. Trivial mitral valve regurgitation. No evidence of mitral stenosis.  4. The aortic valve is tricuspid. Aortic valve regurgitation is not visualized. No aortic stenosis is present.  5. The inferior vena cava is normal in size with greater than 50% respiratory variability, suggesting  right atrial pressure of 3 mmHg. FINDINGS  Left Ventricle: Left ventricular ejection fraction, by estimation, is 55 to 60%. The left ventricle has normal function. The left ventricle has no regional wall motion abnormalities. Definity contrast agent was given IV to delineate the left ventricular  endocardial borders. The left ventricular internal cavity size was normal in size. There is no left ventricular hypertrophy. Left ventricular diastolic parameters were normal. Right Ventricle: The right ventricular size is normal. No increase in right ventricular wall thickness. Right ventricular systolic function is normal. Left Atrium: Left atrial size was normal in size. Right Atrium: Right atrial size was normal in size. Pericardium: There is no evidence of pericardial effusion. Mitral Valve: The mitral valve is normal in structure. Trivial mitral valve regurgitation. No evidence of mitral valve stenosis. Tricuspid Valve: The tricuspid valve is normal in structure. Tricuspid valve regurgitation is trivial. Aortic Valve: The aortic valve is tricuspid. Aortic valve regurgitation is not visualized. No aortic stenosis is present. Aortic valve mean gradient measures 3.5 mmHg. Aortic valve peak gradient measures 6.4 mmHg. Aortic valve area, by VTI measures 2.09 cm. Pulmonic Valve: The pulmonic valve was normal in structure. Pulmonic valve regurgitation is not visualized. No evidence of pulmonic stenosis. Aorta: The aortic root and ascending aorta are structurally normal, with no evidence of dilitation. Pulmonary Artery: The pulmonary artery is of normal size. Venous: The inferior vena cava is normal in size with greater than 50% respiratory variability, suggesting right atrial pressure of 3 mmHg. IAS/Shunts: No atrial level shunt detected by color flow Doppler.  LEFT VENTRICLE PLAX 2D LVIDd:         4.20 cm   Diastology LVIDs:         2.60 cm   LV e' medial:    10.09 cm/s LV PW:         1.00 cm   LV E/e' medial:  9.8 LV IVS:         1.00 cm   LV e' lateral:  12.10 cm/s LVOT diam:     2.00 cm   LV E/e' lateral: 8.2 LV SV:         57 LV SV Index:   27 LVOT Area:     3.14 cm  RIGHT VENTRICLE             IVC RV Basal diam:  4.00 cm     IVC diam: 1.30 cm RV S prime:     12.85 cm/s TAPSE (M-mode): 1.6 cm LEFT ATRIUM             Index        RIGHT ATRIUM           Index LA diam:        4.60 cm 2.17 cm/m   RA Area:     13.90 cm LA Vol (A2C):   44.4 ml 20.96 ml/m  RA Volume:   34.90 ml  16.47 ml/m LA Vol (A4C):   53.4 ml 25.21 ml/m LA Biplane Vol: 50.5 ml 23.84 ml/m  AORTIC VALVE AV Area (Vmax):    2.00 cm AV Area (Vmean):   1.87 cm AV Area (VTI):     2.09 cm AV Vmax:           126.05 cm/s AV Vmean:          89.393 cm/s AV VTI:            0.271 m AV Peak Grad:      6.4 mmHg AV Mean Grad:      3.5 mmHg LVOT Vmax:         80.05 cm/s LVOT Vmean:        53.150 cm/s LVOT VTI:          0.180 m LVOT/AV VTI ratio: 0.67  AORTA Ao Root diam: 3.20 cm Ao Asc diam:  2.90 cm MITRAL VALVE MV Area (PHT): 4.10 cm    SHUNTS MV Decel Time: 185 msec    Systemic VTI:  0.18 m MV E velocity: 99.30 cm/s  Systemic Diam: 2.00 cm MV A velocity: 87.25 cm/s MV E/A ratio:  1.14 Cristal Deer End MD Electronically signed by Kevin Kendall MD Signature Date/Time: 07/26/2022/6:32:54 AM    Final    DG Chest 2 View  Result Date: 07/25/2022 CLINICAL DATA:  Chest pain. EXAM: CHEST - 2 VIEW COMPARISON:  None Available. FINDINGS: Clear lungs. Normal heart size and mediastinal contours. No pleural effusion or pneumothorax. Visualized bones and upper abdomen are unremarkable. IMPRESSION: No evidence of acute cardiopulmonary disease. Electronically Signed   By: Orvan Falconer M.D.   On: 07/25/2022 09:23    Microbiology: No results found for this or any previous visit.  Labs: CBC: Recent Labs  Lab 07/25/22 0848 07/26/22 0706 07/27/22 0438  WBC 7.0 6.9 7.4  HGB 14.5 14.9 13.7  HCT 44.8 45.1 41.8  MCV 91.4 89.8 90.7  PLT 251 227 219   Basic Metabolic  Panel: Recent Labs  Lab 07/25/22 0848 07/27/22 0438  NA 137 137  K 4.4 4.1  CL 107 106  CO2 22 23  GLUCOSE 109* 102*  BUN 11 15  CREATININE 1.07 0.88  CALCIUM 9.9 8.6*   Liver Function Tests: No results for input(s): "AST", "ALT", "ALKPHOS", "BILITOT", "PROT", "ALBUMIN" in the last 168 hours. CBG: No results for input(s): "GLUCAP" in the last 168 hours.  Discharge time spent: approximately 25 minutes spent on discharge counseling, evaluation of patient on day of discharge, and coordination of discharge planning with nursing,  social work, pharmacy and case management  Signed: Alberteen Sam, MD [facility] 07/27/2022

## 2022-07-27 NOTE — TOC Benefit Eligibility Note (Signed)
Patient Product/process development scientist completed.    The patient is currently admitted and upon discharge could be taking Brilinta 90 mg.  The current 30 day co-pay is $83.70.   The patient is insured through [employer]   This test claim was processed through [facility]- copay amounts may vary at other pharmacies due to pharmacy/plan contracts, or as the patient moves through the different stages of their insurance plan.  Roland Earl, CPHT Pharmacy Patient Advocate Specialist [facility] Patient Advocate Team Direct Number: [phone]  Fax: [phone]

## 2022-07-27 NOTE — Progress Notes (Signed)
Progress Note  Patient Name: Kevin Clark Date of Encounter: 07/27/2022  Primary Cardiologist: New - consult by End  Subjective   No chest pain or dyspnea. Has ambulated without issues.   Inpatient Medications    Scheduled Meds:  aspirin EC  81 mg Oral Daily   [START ON 07/28/2022] atorvastatin  80 mg Oral Daily   enoxaparin (LOVENOX) injection  50 mg Subcutaneous Q24H   sodium chloride flush  3 mL Intravenous Q12H   ticagrelor  90 mg Oral BID   Continuous Infusions:  sodium chloride     PRN Meds: sodium chloride, acetaminophen, ondansetron (ZOFRAN) IV, sodium chloride flush   Vital Signs    Vitals:   07/26/22 2049 07/26/22 2315 07/27/22 0521 07/27/22 0815  BP: 137/85 123/66 137/79 (!) 145/96  Pulse: 86 85 61 65  Resp: 20 16 16 16   Temp: 98 F (36.7 C) 97.7 F (36.5 C) 97.9 F (36.6 C) (!) 97.5 F (36.4 C)  TempSrc: Oral     SpO2: 98% 99% 99% 100%  Weight:      Height:        Intake/Output Summary (Last 24 hours) at 07/27/2022 1005 Last data filed at 07/27/2022 0438 Gross per 24 hour  Intake 1391.35 ml  Output 400 ml  Net 991.35 ml   Filed Weights   07/25/22 0846 07/26/22 1358  Weight: 106.6 kg 106.6 kg    Telemetry    Sinus rhythm with artifact, rates in the 60s bpm - Personally Reviewed  ECG    Sinus bradycardia, 56 bpm, no acute st/t changes - Personally Reviewed  Physical Exam   GEN: No acute distress.   Neck: No JVD. Cardiac: RRR, no murmurs, rubs, or gallops. Right radial arteriotomy site without active bleeding, bruising, swelling, warmth, erythema, or TTP. Radial pulse 2+ proximal and distal to the arteriotomy site.  Respiratory: Clear to auscultation bilaterally.  GI: Soft, nontender, non-distended.   MS: No edema; No deformity. Neuro:  Alert and oriented x 3; Nonfocal.  Psych: Normal affect.  Labs    Chemistry Recent Labs  Lab 07/25/22 0848 07/27/22 0438  NA 137 137  K 4.4 4.1  CL 107 106  CO2 22 23  GLUCOSE 109*  102*  BUN 11 15  CREATININE 1.07 0.88  CALCIUM 9.9 8.6*  GFRNONAA >60 >60  ANIONGAP 8 8     Hematology Recent Labs  Lab 07/25/22 0848 07/26/22 0706 07/27/22 0438  WBC 7.0 6.9 7.4  RBC 4.90 5.02 4.61  HGB 14.5 14.9 13.7  HCT 44.8 45.1 41.8  MCV 91.4 89.8 90.7  MCH 29.6 29.7 29.7  MCHC 32.4 33.0 32.8  RDW 12.6 12.7 12.6  PLT 251 227 219    Cardiac EnzymesNo results for input(s): "TROPONINI" in the last 168 hours. No results for input(s): "TROPIPOC" in the last 168 hours.   BNPNo results for input(s): "BNP", "PROBNP" in the last 168 hours.   DDimer No results for input(s): "DDIMER" in the last 168 hours.   Radiology    CXR 07/25/2022: IMPRESSION: No evidence of acute cardiopulmonary disease. Electronically Signed By: Orvan Falconer M.D. On: 07/25/2022 09:23   Cardiac Studies   LHC 07/26/2022: Conclusions: Severe single-vessel coronary artery disease with thrombotic 99% stenosis of inferior branch of OM1.  There is also 50-60% mid LAD disease and 20-30% stenosis of the mid/distal LMCA. Normal left ventricular filling pressure. Successful PCI to inferior branch of OM1 using Onyx Frontier 2.5 x 18 mm drug-eluting stent (postdilated to  2.8 mm) with 0% residual stenosis and TIMI-3 flow.   Recommendations: Dual antiplatelet therapy with aspirin and ticagrelor for at least 12 months. Aggressive secondary prevention of coronary artery disease. Medical therapy of moderate LAD disease.  If the patient has recurrent chest pain, functional assessment to determine hemodynamic significance of LAD stenosis should be considered. __________  2D echo 07/25/2022: 1. Left ventricular ejection fraction, by estimation, is 55 to 60%. The  left ventricle has normal function. The left ventricle has no regional  wall motion abnormalities. Left ventricular diastolic parameters were  normal.   2. Right ventricular systolic function is normal. The right ventricular  size is normal.   3. The  mitral valve is normal in structure. Trivial mitral valve  regurgitation. No evidence of mitral stenosis.   4. The aortic valve is tricuspid. Aortic valve regurgitation is not  visualized. No aortic stenosis is present.   5. The inferior vena cava is normal in size with greater than 50%  respiratory variability, suggesting right atrial pressure of 3 mmHg.   Patient Profile     42 y.o. male with history of HLD, obesity, and prior tobacco use admitted with NSTEMI.   Assessment & Plan    NSTEMI: -Status post PCI to the inferior branch of OM1 on 07/26/2022 -Favor medical therapy for moderate LAD disease with consideration for functional assessment to determine hemodynamic significance of LAD stenosis if he has recurrent angina -Without symptoms of angina or cardiac decompensation  -Continue DAPT with ASA 81 mg daily and Brilinta 90 mg bid (has coupon card) -Lipitor 80 mg daily in place of PTA simvastatin  -Start Toprol XL 25 mg daily -Post cath instructions -Has been referred to cardiac rehab   Hyperlipidemia: -LDL 131 with goal being < 55 -Agree with transition from simvastatin to atorvastatin at titrated dose of 80 mg -Follow up lipid panel and LFT in the outpatient setting in 2 months with recommendation to escalate lipid lowering therapy as indicated to achieve target LDL   I will message our office to arrange for follow up in 1-2 weeks      For questions or updates, please contact [facility] Please consult [email or web address] for contact info under Cardiology/STEMI.    Signed, Eula Listen, PA-C [facility] Pager: [phone] 07/27/2022, 10:05 AM

## 2022-07-27 NOTE — Telephone Encounter (Signed)
Pharmacy Patient Advocate Encounter  Insurance verification completed.    The patient is insured through [employer]   The patient is currently admitted and ran test claims for the following: Brilinta.  Copays and coinsurance results were relayed to Inpatient clinical team.

## 2022-07-28 LAB — LIPOPROTEIN A (LPA): Lipoprotein (a): 195.5 nmol/L — ABNORMAL HIGH (ref <75.0)

## 2022-08-01 DIAGNOSIS — I2102 ST elevation (STEMI) myocardial infarction involving left anterior descending coronary artery: Secondary | ICD-10-CM | POA: Diagnosis not present

## 2022-08-01 DIAGNOSIS — E782 Mixed hyperlipidemia: Secondary | ICD-10-CM | POA: Diagnosis not present

## 2022-08-02 ENCOUNTER — Ambulatory Visit: Payer: 59 | Admitting: General Practice

## 2022-08-03 ENCOUNTER — Encounter: Payer: 59 | Attending: Cardiovascular Disease | Admitting: *Deleted

## 2022-08-03 ENCOUNTER — Encounter: Payer: Self-pay | Admitting: *Deleted

## 2022-08-03 DIAGNOSIS — Z955 Presence of coronary angioplasty implant and graft: Secondary | ICD-10-CM

## 2022-08-03 DIAGNOSIS — I214 Non-ST elevation (NSTEMI) myocardial infarction: Secondary | ICD-10-CM

## 2022-08-03 NOTE — Progress Notes (Signed)
Initial phone call completed. Dx can be found in [facility] 5/14. EP orientation scheduled for 6/3 at 10:00 am.

## 2022-08-14 ENCOUNTER — Ambulatory Visit: Payer: 59 | Attending: General Practice | Admitting: Cardiology

## 2022-08-14 ENCOUNTER — Other Ambulatory Visit: Payer: Self-pay

## 2022-08-14 ENCOUNTER — Encounter: Payer: Self-pay | Admitting: Cardiology

## 2022-08-14 ENCOUNTER — Encounter: Payer: 59 | Attending: Cardiovascular Disease | Admitting: *Deleted

## 2022-08-14 VITALS — Ht 65.5 in | Wt 236.4 lb

## 2022-08-14 VITALS — BP 136/88 | HR 73 | Ht 65.0 in | Wt 239.2 lb

## 2022-08-14 DIAGNOSIS — I2510 Atherosclerotic heart disease of native coronary artery without angina pectoris: Secondary | ICD-10-CM | POA: Diagnosis not present

## 2022-08-14 DIAGNOSIS — I214 Non-ST elevation (NSTEMI) myocardial infarction: Secondary | ICD-10-CM | POA: Diagnosis not present

## 2022-08-14 DIAGNOSIS — E669 Obesity, unspecified: Secondary | ICD-10-CM | POA: Diagnosis not present

## 2022-08-14 DIAGNOSIS — E782 Mixed hyperlipidemia: Secondary | ICD-10-CM

## 2022-08-14 DIAGNOSIS — Z955 Presence of coronary angioplasty implant and graft: Secondary | ICD-10-CM | POA: Insufficient documentation

## 2022-08-14 MED ORDER — METOPROLOL SUCCINATE ER 25 MG PO TB24
25.0000 mg | ORAL_TABLET | Freq: Every day | ORAL | 3 refills | Status: DC
  Filled 2022-08-14 – 2022-08-16 (×2): qty 90, 90d supply, fill #0
  Filled 2022-09-20 – 2022-10-16 (×3): qty 90, 90d supply, fill #1

## 2022-08-14 MED ORDER — REPATHA SURECLICK 140 MG/ML ~~LOC~~ SOAJ
140.0000 mg | SUBCUTANEOUS | 2 refills | Status: DC
  Filled 2022-08-14: qty 2, 28d supply, fill #0
  Filled 2022-09-13: qty 2, 28d supply, fill #1
  Filled 2022-10-13: qty 2, 28d supply, fill #2

## 2022-08-14 MED ORDER — ATORVASTATIN CALCIUM 40 MG PO TABS
40.0000 mg | ORAL_TABLET | Freq: Every day | ORAL | 3 refills | Status: DC
  Filled 2022-08-14: qty 90, 90d supply, fill #0
  Filled 2022-09-20 – 2022-11-08 (×4): qty 90, 90d supply, fill #1
  Filled 2022-12-05 – 2023-01-27 (×3): qty 90, 90d supply, fill #2
  Filled 2023-05-09: qty 90, 90d supply, fill #3

## 2022-08-14 MED ORDER — NITROGLYCERIN 0.4 MG SL SUBL
0.4000 mg | SUBLINGUAL_TABLET | SUBLINGUAL | 2 refills | Status: DC | PRN
  Filled 2022-08-14: qty 25, 1d supply, fill #0
  Filled 2022-09-03: qty 25, 1d supply, fill #1
  Filled 2022-12-05: qty 25, 1d supply, fill #2

## 2022-08-14 MED ORDER — TICAGRELOR 90 MG PO TABS
90.0000 mg | ORAL_TABLET | Freq: Two times a day (BID) | ORAL | 3 refills | Status: DC
  Filled 2022-08-14: qty 60, 30d supply, fill #0
  Filled 2022-09-20: qty 60, 30d supply, fill #1
  Filled 2022-10-13: qty 60, 30d supply, fill #2
  Filled 2022-11-08: qty 60, 30d supply, fill #3
  Filled 2022-12-05: qty 60, 30d supply, fill #4
  Filled 2023-01-03: qty 60, 30d supply, fill #5
  Filled 2023-02-11: qty 60, 30d supply, fill #6
  Filled 2023-03-10: qty 60, 30d supply, fill #7
  Filled 2023-04-09: qty 60, 30d supply, fill #8
  Filled 2023-05-09: qty 60, 30d supply, fill #9
  Filled 2023-06-06: qty 60, 30d supply, fill #10
  Filled 2023-07-09: qty 60, 30d supply, fill #11

## 2022-08-14 NOTE — Patient Instructions (Signed)
Medication Instructions:  Your physician has recommended you make the following change in your medication:   DECREASE Atorvastatin to 40 mg once daily  START Repatha 140 mg every 14 days Nitroglycerin as needed.  If a single episode of chest pain is not relieved by one tablet, the patient will try another within 5 minutes; and if this doesn't relieve the pain, the patient is instructed to call 911 for transportation to an emergency department.   *If you need a refill on your cardiac medications before your next appointment, please call your pharmacy*   Lab Work: Lipid & Liver panel in the middle of July. These are fasting labs so nothing to eat or drink after midnight before except sip of water with your medications. No appointment is needed. Just go to the [facility] entrance and check in at the registration desk.   If you have labs (blood work) drawn today and your tests are completely normal, you will receive your results only by: MyChart Message (if you have MyChart) OR A paper copy in the mail If you have any lab test that is abnormal or we need to change your treatment, we will call you to review the results.   Testing/Procedures: None   Follow-Up: At [facility], you and your health needs are our priority.  As part of our continuing mission to provide you with exceptional heart care, we have created designated Provider Care Teams.  These Care Teams include your primary Cardiologist (physician) and Advanced Practice Providers (APPs -  Physician Assistants and Nurse Practitioners) who all work together to provide you with the care you need, when you need it.     Your next appointment:   2 month(s)  Provider:   Charlsie Quest, NP

## 2022-08-14 NOTE — Patient Instructions (Signed)
Patient Instructions  Patient Details  Name: Kevin Clark MRN: [id number] Date of Birth: Sep 10, 1980 Referring Provider:  Yvonne Kendall, MD  Below are your personal goals for exercise, nutrition, and risk factors. Our goal is to help you stay on track towards obtaining and maintaining these goals. We will be discussing your progress on these goals with you throughout the program.  Initial Exercise Prescription:  Initial Exercise Prescription - 08/14/22 1100       Date of Initial Exercise RX and Referring Provider   Date 08/14/22    Referring Provider End, Cristal Deer MD      Oxygen   Maintain Oxygen Saturation 88% or higher      Treadmill   MPH 2.7    Grade 2    Minutes 15    METs 3.81      Elliptical   Level 1    Speed 3.9    Minutes 15    METs 3.8      Prescription Details   Frequency (times per week) 3    Duration Progress to 30 minutes of continuous aerobic without signs/symptoms of physical distress      Intensity   THRR 40-80% of Max Heartrate 113-156    Ratings of Perceived Exertion 11-13    Perceived Dyspnea 0-4      Progression   Progression Continue to progress workloads to maintain intensity without signs/symptoms of physical distress.      Resistance Training   Training Prescription Yes    Weight 7 lb    Reps 10-15             Exercise Goals: Frequency: Be able to perform aerobic exercise two to three times per week in program working toward 2-5 days per week of home exercise.  Intensity: Work with a perceived exertion of 11 (fairly light) - 15 (hard) while following your exercise prescription.  We will make changes to your prescription with you as you progress through the program.   Duration: Be able to do 30 to 45 minutes of continuous aerobic exercise in addition to a 5 minute warm-up and a 5 minute cool-down routine.   Nutrition Goals: Your personal nutrition goals will be established when you do your nutrition analysis with the  dietician.  The following are general nutrition guidelines to follow: Cholesterol < 200mg /day Sodium < 1500mg /day Fiber: Men under 50 yrs - 38 grams per day  Personal Goals:  Personal Goals and Risk Factors at Admission - 08/14/22 1146       Core Components/Risk Factors/Patient Goals on Admission    Weight Management Yes;Obesity;Weight Loss    Intervention Weight Management: Develop a combined nutrition and exercise program designed to reach desired caloric intake, while maintaining appropriate intake of nutrient and fiber, sodium and fats, and appropriate energy expenditure required for the weight goal.;Weight Management: Provide education and appropriate resources to help participant work on and attain dietary goals.;Weight Management/Obesity: Establish reasonable short term and long term weight goals.;Obesity: Provide education and appropriate resources to help participant work on and attain dietary goals.    Admit Weight 236 lb 6.4 oz (107.2 kg)    Goal Weight: Short Term 230 lb (104.3 kg)    Goal Weight: Long Term 225 lb (102.1 kg)    Expected Outcomes Short Term: Continue to assess and modify interventions until short term weight is achieved;Long Term: Adherence to nutrition and physical activity/exercise program aimed toward attainment of established weight goal;Weight Loss: Understanding of general recommendations for a balanced deficit  meal plan, which promotes 1-2 lb weight loss per week and includes a negative energy balance of [phone] kcal/d;Understanding recommendations for meals to include 15-35% energy as protein, 25-35% energy from fat, 35-60% energy from carbohydrates, less than 200mg  of dietary cholesterol, 20-35 gm of total fiber daily;Understanding of distribution of calorie intake throughout the day with the consumption of 4-5 meals/snacks    Hypertension Yes    Intervention Provide education on lifestyle modifcations including regular physical activity/exercise, weight  management, moderate sodium restriction and increased consumption of fresh fruit, vegetables, and low fat dairy, alcohol moderation, and smoking cessation.;Monitor prescription use compliance.    Expected Outcomes Short Term: Continued assessment and intervention until BP is < 140/60mm HG in hypertensive participants. < 130/60mm HG in hypertensive participants with diabetes, heart failure or chronic kidney disease.;Long Term: Maintenance of blood pressure at goal levels.    Lipids Yes    Intervention Provide education and support for participant on nutrition & aerobic/resistive exercise along with prescribed medications to achieve LDL 70mg , HDL >40mg .    Expected Outcomes Short Term: Participant states understanding of desired cholesterol values and is compliant with medications prescribed. Participant is following exercise prescription and nutrition guidelines.;Long Term: Cholesterol controlled with medications as prescribed, with individualized exercise RX and with personalized nutrition plan. Value goals: LDL < 70mg , HDL > 40 mg.             Tobacco Use Initial Evaluation: Social History   Tobacco Use  Smoking Status Some Days   Types: Cigars  Smokeless Tobacco Never    Exercise Goals and Review:  Exercise Goals     Row Name 08/14/22 1144             Exercise Goals   Increase Physical Activity Yes       Intervention Provide advice, education, support and counseling about physical activity/exercise needs.;Develop an individualized exercise prescription for aerobic and resistive training based on initial evaluation findings, risk stratification, comorbidities and participant's personal goals.       Expected Outcomes Short Term: Attend rehab on a regular basis to increase amount of physical activity.;Long Term: Exercising regularly at least 3-5 days a week.;Long Term: Add in home exercise to make exercise part of routine and to increase amount of physical activity.       Increase  Strength and Stamina Yes       Intervention Provide advice, education, support and counseling about physical activity/exercise needs.;Develop an individualized exercise prescription for aerobic and resistive training based on initial evaluation findings, risk stratification, comorbidities and participant's personal goals.       Expected Outcomes Short Term: Increase workloads from initial exercise prescription for resistance, speed, and METs.;Short Term: Perform resistance training exercises routinely during rehab and add in resistance training at home;Long Term: Improve cardiorespiratory fitness, muscular endurance and strength as measured by increased METs and functional capacity ( )       Able to understand and use rate of perceived exertion (RPE) scale Yes       Intervention Provide education and explanation on how to use RPE scale       Expected Outcomes Short Term: Able to use RPE daily in rehab to express subjective intensity level;Long Term:  Able to use RPE to guide intensity level when exercising independently       Able to understand and use Dyspnea scale Yes       Intervention Provide education and explanation on how to use Dyspnea scale  Expected Outcomes Short Term: Able to use Dyspnea scale daily in rehab to express subjective sense of shortness of breath during exertion;Long Term: Able to use Dyspnea scale to guide intensity level when exercising independently       Knowledge and understanding of Target Heart Rate Range (THRR) Yes       Intervention Provide education and explanation of THRR including how the numbers were predicted and where they are located for reference       Expected Outcomes Short Term: Able to state/look up THRR;Short Term: Able to use daily as guideline for intensity in rehab;Long Term: Able to use THRR to govern intensity when exercising independently       Able to check pulse independently Yes       Intervention Provide education and demonstration on how  to check pulse in carotid and radial arteries.;Review the importance of being able to check your own pulse for safety during independent exercise       Expected Outcomes Long Term: Able to check pulse independently and accurately;Short Term: Able to explain why pulse checking is important during independent exercise       Understanding of Exercise Prescription Yes       Intervention Provide education, explanation, and written materials on patient's individual exercise prescription       Expected Outcomes Short Term: Able to explain program exercise prescription;Long Term: Able to explain home exercise prescription to exercise independently              Copy of goals given to participant.

## 2022-08-14 NOTE — Progress Notes (Signed)
Cardiac Individual Treatment Plan  Patient Details  Name: Kevin Clark MRN: [id number] Date of Birth: 1981/02/20 Referring Provider:   Flowsheet Row Cardiac Rehab from 08/14/2022 in [facility] Cardiac and Pulmonary Rehab  Referring Provider End, Cristal Deer MD       Initial Encounter Date:  Flowsheet Row Cardiac Rehab from 08/14/2022 in [facility] Cardiac and Pulmonary Rehab  Date 08/14/22       Visit Diagnosis: NSTEMI (non-ST elevation myocardial infarction) [facility])  Status post coronary artery stent placement  Patient's Home Medications on Admission:  Current Outpatient Medications:    aspirin EC 81 MG tablet, Take 1 tablet (81 mg total) by mouth daily. Swallow whole., Disp: 30 tablet, Rfl: 12   atorvastatin (LIPITOR) 80 MG tablet, Take 1 tablet (80 mg total) by mouth daily., Disp: 30 tablet, Rfl: 3   metoprolol succinate (TOPROL-XL) 25 MG 24 hr tablet, Take 1 tablet (25 mg total) by mouth daily., Disp: 30 tablet, Rfl: 3   ticagrelor (BRILINTA) 90 MG TABS tablet, Take 1 tablet (90 mg total) by mouth 2 (two) times daily., Disp: 60 tablet, Rfl: 3  Past Medical History: Past Medical History:  Diagnosis Date   Hyperlipidemia    Obesity    Palpitations     Tobacco Use: Social History   Tobacco Use  Smoking Status Some Days   Types: Cigars  Smokeless Tobacco Never    Labs: Review Flowsheet       Latest Ref Rng & Units 07/26/2022  Labs for ITP Cardiac and Pulmonary Rehab  Cholestrol 0 - 200 mg/dL 413   LDL (calc) 0 - 99 mg/dL 244   HDL-C >01 mg/dL 47   Trlycerides <027 mg/dL 253      Exercise Target Goals: Exercise Program Goal: Individual exercise prescription set using results from initial 6 min walk test and THRR while considering  patient's activity barriers and safety.   Exercise Prescription Goal: Initial exercise prescription builds to 30-45 minutes a day of aerobic activity, 2-3 days per week.  Home exercise guidelines will be given to patient during program as  part of exercise prescription that the participant will acknowledge.   Education: Aerobic Exercise: - Group verbal and visual presentation on the components of exercise prescription. Introduces F.I.T.T principle from ACSM for exercise prescriptions.  Reviews F.I.T.T. principles of aerobic exercise including progression. Written material given at graduation. Flowsheet Row Cardiac Rehab from 08/14/2022 in [facility] Cardiac and Pulmonary Rehab  Education need identified 08/14/22       Education: Resistance Exercise: - Group verbal and visual presentation on the components of exercise prescription. Introduces F.I.T.T principle from ACSM for exercise prescriptions  Reviews F.I.T.T. principles of resistance exercise including progression. Written material given at graduation.    Education: Exercise & Equipment Safety: - Individual verbal instruction and demonstration of equipment use and safety with use of the equipment. Flowsheet Row Cardiac Rehab from 08/14/2022 in [facility] Cardiac and Pulmonary Rehab  Date 08/14/22  Educator [facility]  Instruction Review Code 1- Verbalizes Understanding       Education: Exercise Physiology & General Exercise Guidelines: - Group verbal and written instruction with models to review the exercise physiology of the cardiovascular system and associated critical values. Provides general exercise guidelines with specific guidelines to those with heart or lung disease.    Education: Flexibility, Balance, Mind/Body Relaxation: - Group verbal and visual presentation with interactive activity on the components of exercise prescription. Introduces F.I.T.T principle from ACSM for exercise prescriptions. Reviews F.I.T.T. principles of flexibility and balance  exercise training including progression. Also discusses the mind body connection.  Reviews various relaxation techniques to help reduce and manage stress (i.e. Deep breathing, progressive muscle relaxation, and visualization). Balance  handout provided to take home. Written material given at graduation.   Activity Barriers & Risk Stratification:  Activity Barriers & Cardiac Risk Stratification - 08/14/22 1142       Activity Barriers & Cardiac Risk Stratification   Activity Barriers None    Cardiac Risk Stratification Moderate             6 Minute Walk:  6 Minute Walk     Row Name 08/14/22 1141         6 Minute Walk   Phase Initial     Distance 70 feet     Walk Time 6 minutes     # of Rest Breaks 0     MPH 2.65     METS 3.99     RPE 7     VO2 Peak 13.98     Symptoms No     Resting HR 70 bpm     Resting BP 122/62     Resting Oxygen Saturation  98 %     Exercise Oxygen Saturation  during 6 min walk 96 %     Max Ex. HR 105 bpm     Max Ex. BP 134/66     2 Minute Post BP 134/70              Oxygen Initial Assessment:   Oxygen Re-Evaluation:   Oxygen Discharge (Final Oxygen Re-Evaluation):   Initial Exercise Prescription:  Initial Exercise Prescription - 08/14/22 1100       Date of Initial Exercise RX and Referring Provider   Date 08/14/22    Referring Provider End, Cristal Deer MD      Oxygen   Maintain Oxygen Saturation 88% or higher      Treadmill   MPH 2.7    Grade 2    Minutes 15    METs 3.81      Elliptical   Level 1    Speed 3.9    Minutes 15    METs 3.8      Prescription Details   Frequency (times per week) 3    Duration Progress to 30 minutes of continuous aerobic without signs/symptoms of physical distress      Intensity   THRR 40-80% of Max Heartrate 113-156    Ratings of Perceived Exertion 11-13    Perceived Dyspnea 0-4      Progression   Progression Continue to progress workloads to maintain intensity without signs/symptoms of physical distress.      Resistance Training   Training Prescription Yes    Weight 7 lb    Reps 10-15             Perform Capillary Blood Glucose checks as needed.  Exercise Prescription Changes:   Exercise  Prescription Changes     Row Name 08/14/22 1100             Response to Exercise   Blood Pressure (Admit) 122/62       Blood Pressure (Exercise) 134/66       Blood Pressure (Exit) 142/74  recheck 134/70       Heart Rate (Admit) 70 bpm       Heart Rate (Exercise) 105 bpm       Heart Rate (Exit) 75 bpm       Oxygen Saturation (Admit) 98 %  Oxygen Saturation (Exercise) 96 %       Rating of Perceived Exertion (Exercise) 7       Symptoms none       Comments walk test results                Exercise Comments:   Exercise Goals and Review:   Exercise Goals     Row Name 08/14/22 1144             Exercise Goals   Increase Physical Activity Yes       Intervention Provide advice, education, support and counseling about physical activity/exercise needs.;Develop an individualized exercise prescription for aerobic and resistive training based on initial evaluation findings, risk stratification, comorbidities and participant's personal goals.       Expected Outcomes Short Term: Attend rehab on a regular basis to increase amount of physical activity.;Long Term: Exercising regularly at least 3-5 days a week.;Long Term: Add in home exercise to make exercise part of routine and to increase amount of physical activity.       Increase Strength and Stamina Yes       Intervention Provide advice, education, support and counseling about physical activity/exercise needs.;Develop an individualized exercise prescription for aerobic and resistive training based on initial evaluation findings, risk stratification, comorbidities and participant's personal goals.       Expected Outcomes Short Term: Increase workloads from initial exercise prescription for resistance, speed, and METs.;Short Term: Perform resistance training exercises routinely during rehab and add in resistance training at home;Long Term: Improve cardiorespiratory fitness, muscular endurance and strength as measured by increased  METs and functional capacity ( )       Able to understand and use rate of perceived exertion (RPE) scale Yes       Intervention Provide education and explanation on how to use RPE scale       Expected Outcomes Short Term: Able to use RPE daily in rehab to express subjective intensity level;Long Term:  Able to use RPE to guide intensity level when exercising independently       Able to understand and use Dyspnea scale Yes       Intervention Provide education and explanation on how to use Dyspnea scale       Expected Outcomes Short Term: Able to use Dyspnea scale daily in rehab to express subjective sense of shortness of breath during exertion;Long Term: Able to use Dyspnea scale to guide intensity level when exercising independently       Knowledge and understanding of Target Heart Rate Range (THRR) Yes       Intervention Provide education and explanation of THRR including how the numbers were predicted and where they are located for reference       Expected Outcomes Short Term: Able to state/look up THRR;Short Term: Able to use daily as guideline for intensity in rehab;Long Term: Able to use THRR to govern intensity when exercising independently       Able to check pulse independently Yes       Intervention Provide education and demonstration on how to check pulse in carotid and radial arteries.;Review the importance of being able to check your own pulse for safety during independent exercise       Expected Outcomes Long Term: Able to check pulse independently and accurately;Short Term: Able to explain why pulse checking is important during independent exercise       Understanding of Exercise Prescription Yes       Intervention Provide education, explanation, and  written materials on patient's individual exercise prescription       Expected Outcomes Short Term: Able to explain program exercise prescription;Long Term: Able to explain home exercise prescription to exercise independently                 Exercise Goals Re-Evaluation :   Discharge Exercise Prescription (Final Exercise Prescription Changes):  Exercise Prescription Changes - 08/14/22 1100       Response to Exercise   Blood Pressure (Admit) 122/62    Blood Pressure (Exercise) 134/66    Blood Pressure (Exit) 142/74   recheck 134/70   Heart Rate (Admit) 70 bpm    Heart Rate (Exercise) 105 bpm    Heart Rate (Exit) 75 bpm    Oxygen Saturation (Admit) 98 %    Oxygen Saturation (Exercise) 96 %    Rating of Perceived Exertion (Exercise) 7    Symptoms none    Comments walk test results             Nutrition:  Target Goals: Understanding of nutrition guidelines, daily intake of sodium 1500mg , cholesterol 200mg , calories 30% from fat and 7% or less from saturated fats, daily to have 5 or more servings of fruits and vegetables.  Education: All About Nutrition: -Group instruction provided by verbal, written material, interactive activities, discussions, models, and posters to present general guidelines for heart healthy nutrition including fat, fiber, MyPlate, the role of sodium in heart healthy nutrition, utilization of the nutrition label, and utilization of this knowledge for meal planning. Follow up email sent as well. Written material given at graduation. Flowsheet Row Cardiac Rehab from 08/14/2022 in [facility] Cardiac and Pulmonary Rehab  Education need identified 08/14/22       Biometrics:  Pre Biometrics - 08/14/22 1144       Pre Biometrics   Height 5' 5.5" (1.664 m)    Weight 236 lb 6.4 oz (107.2 kg)    Waist Circumference 45 inches    Hip Circumference 42 inches    Waist to Hip Ratio 1.07 %    BMI (Calculated) 38.73    Single Leg Stand 30 seconds              Nutrition Therapy Plan and Nutrition Goals:  Nutrition Therapy & Goals - 08/14/22 1145       Intervention Plan   Intervention Prescribe, educate and counsel regarding individualized specific dietary modifications aiming towards  targeted core components such as weight, hypertension, lipid management, diabetes, heart failure and other comorbidities.    Expected Outcomes Short Term Goal: Understand basic principles of dietary content, such as calories, fat, sodium, cholesterol and nutrients.;Short Term Goal: A plan has been developed with personal nutrition goals set during dietitian appointment.;Long Term Goal: Adherence to prescribed nutrition plan.             Nutrition Assessments:  MEDIFICTS Score Key: ?70 Need to make dietary changes  40-70 Heart Healthy Diet ? 40 Therapeutic Level Cholesterol Diet  Flowsheet Row Cardiac Rehab from 08/14/2022 in [facility] Cardiac and Pulmonary Rehab  Picture Your Plate Total Score on Admission 57      Picture Your Plate Scores: <16 Unhealthy dietary pattern with much room for improvement. 41-50 Dietary pattern unlikely to meet recommendations for good health and room for improvement. 51-60 More healthful dietary pattern, with some room for improvement.  >60 Healthy dietary pattern, although there may be some specific behaviors that could be improved.    Nutrition Goals Re-Evaluation:   Nutrition Goals Discharge (  Final Nutrition Goals Re-Evaluation):   Psychosocial: Target Goals: Acknowledge presence or absence of significant depression and/or stress, maximize coping skills, provide positive support system. Participant is able to verbalize types and ability to use techniques and skills needed for reducing stress and depression.   Education: Stress, Anxiety, and Depression - Group verbal and visual presentation to define topics covered.  Reviews how body is impacted by stress, anxiety, and depression.  Also discusses healthy ways to reduce stress and to treat/manage anxiety and depression.  Written material given at graduation.   Education: Sleep Hygiene -Provides group verbal and written instruction about how sleep can affect your health.  Define sleep hygiene, discuss  sleep cycles and impact of sleep habits. Review good sleep hygiene tips.    Initial Review & Psychosocial Screening:  Initial Psych Review & Screening - 08/03/22 1550       Initial Review   Current issues with None Identified      Family Dynamics   Good Support System? Yes   Wife            Quality of Life Scores:   Quality of Life - 08/14/22 1145       Quality of Life   Select Quality of Life      Quality of Life Scores   Health/Function Pre 21.02 %    Socioeconomic Pre 23.29 %    Psych/Spiritual Pre 17.79 %    Family Pre 25.2 %    GLOBAL Pre 21.51 %            Scores of 19 and below usually indicate a poorer quality of life in these areas.  A difference of  2-3 points is a clinically meaningful difference.  A difference of 2-3 points in the total score of the Quality of Life Index has been associated with significant improvement in overall quality of life, self-image, physical symptoms, and general health in studies assessing change in quality of life.  PHQ-9: Review Flowsheet       08/14/2022  Depression screen PHQ 2/9  Decreased Interest 0  Down, Depressed, Hopeless 1  PHQ - 2 Score 1  Altered sleeping 0  Tired, decreased energy 0  Change in appetite 0  Feeling bad or failure about yourself  1  Trouble concentrating 0  Moving slowly or fidgety/restless 0  Suicidal thoughts 0  PHQ-9 Score 2  Difficult doing work/chores Not difficult at all   Interpretation of Total Score  Total Score Depression Severity:  1-4 = Minimal depression, 5-9 = Mild depression, 10-14 = Moderate depression, 15-19 = Moderately severe depression, 20-27 = Severe depression   Psychosocial Evaluation and Intervention:  Psychosocial Evaluation - 08/03/22 1550       Psychosocial Evaluation & Interventions   Comments Kevin "Rocco" is coming to cardiac rehab post NSTEMI w/ stent. He reports no concerns with stress and he is back to work. He reports a good support system including his  wife. He is feeling back to baseline and has no barriers to attending the program.    Expected Outcomes Short: Attend cardiac rehab for education and exercise. Long: Develop and maintain positive self care habits.    Continue Psychosocial Services  Follow up required by staff             Psychosocial Re-Evaluation:   Psychosocial Discharge (Final Psychosocial Re-Evaluation):   Vocational Rehabilitation: Provide vocational rehab assistance to qualifying candidates.   Vocational Rehab Evaluation & Intervention:  Vocational Rehab - 08/03/22 1550  Initial Vocational Rehab Evaluation & Intervention   Assessment shows need for Vocational Rehabilitation No             Education: Education Goals: Education classes will be provided on a variety of topics geared toward better understanding of heart health and risk factor modification. Participant will state understanding/return demonstration of topics presented as noted by education test scores.  Learning Barriers/Preferences:  Learning Barriers/Preferences - 08/03/22 1550       Learning Barriers/Preferences   Learning Barriers None    Learning Preferences None             General Cardiac Education Topics:  AED/CPR: - Group verbal and written instruction with the use of models to demonstrate the basic use of the AED with the basic ABC's of resuscitation.   Anatomy and Cardiac Procedures: - Group verbal and visual presentation and models provide information about basic cardiac anatomy and function. Reviews the testing methods done to diagnose heart disease and the outcomes of the test results. Describes the treatment choices: Medical Management, Angioplasty, or Coronary Bypass Surgery for treating various heart conditions including Myocardial Infarction, Angina, Valve Disease, and Cardiac Arrhythmias.  Written material given at graduation. Flowsheet Row Cardiac Rehab from 08/14/2022 in [facility] Cardiac and Pulmonary Rehab   Education need identified 08/14/22       Medication Safety: - Group verbal and visual instruction to review commonly prescribed medications for heart and lung disease. Reviews the medication, class of the drug, and side effects. Includes the steps to properly store meds and maintain the prescription regimen.  Written material given at graduation.   Intimacy: - Group verbal instruction through game format to discuss how heart and lung disease can affect sexual intimacy. Written material given at graduation..   Know Your Numbers and Heart Failure: - Group verbal and visual instruction to discuss disease risk factors for cardiac and pulmonary disease and treatment options.  Reviews associated critical values for Overweight/Obesity, Hypertension, Cholesterol, and Diabetes.  Discusses basics of heart failure: signs/symptoms and treatments.  Introduces Heart Failure Zone chart for action plan for heart failure.  Written material given at graduation.   Infection Prevention: - Provides verbal and written material to individual with discussion of infection control including proper hand washing and proper equipment cleaning during exercise session. Flowsheet Row Cardiac Rehab from 08/14/2022 in [facility] Cardiac and Pulmonary Rehab  Date 08/14/22  Educator [facility]  Instruction Review Code 1- Verbalizes Understanding       Falls Prevention: - Provides verbal and written material to individual with discussion of falls prevention and safety. Flowsheet Row Cardiac Rehab from 08/14/2022 in [facility] Cardiac and Pulmonary Rehab  Date 08/14/22  Educator [facility]  Instruction Review Code 1- Verbalizes Understanding       Other: -Provides group and verbal instruction on various topics (see comments)   Knowledge Questionnaire Score:  Knowledge Questionnaire Score - 08/14/22 1146       Knowledge Questionnaire Score   Pre Score 20/26             Core Components/Risk Factors/Patient Goals at Admission:   Personal Goals and Risk Factors at Admission - 08/14/22 1146       Core Components/Risk Factors/Patient Goals on Admission    Weight Management Yes;Obesity;Weight Loss    Intervention Weight Management: Develop a combined nutrition and exercise program designed to reach desired caloric intake, while maintaining appropriate intake of nutrient and fiber, sodium and fats, and appropriate energy expenditure required for the weight goal.;Weight Management: Provide  education and appropriate resources to help participant work on and attain dietary goals.;Weight Management/Obesity: Establish reasonable short term and long term weight goals.;Obesity: Provide education and appropriate resources to help participant work on and attain dietary goals.    Admit Weight 236 lb 6.4 oz (107.2 kg)    Goal Weight: Short Term 230 lb (104.3 kg)    Goal Weight: Long Term 225 lb (102.1 kg)    Expected Outcomes Short Term: Continue to assess and modify interventions until short term weight is achieved;Long Term: Adherence to nutrition and physical activity/exercise program aimed toward attainment of established weight goal;Weight Loss: Understanding of general recommendations for a balanced deficit meal plan, which promotes 1-2 lb weight loss per week and includes a negative energy balance of [phone] kcal/d;Understanding recommendations for meals to include 15-35% energy as protein, 25-35% energy from fat, 35-60% energy from carbohydrates, less than 200mg  of dietary cholesterol, 20-35 gm of total fiber daily;Understanding of distribution of calorie intake throughout the day with the consumption of 4-5 meals/snacks    Hypertension Yes    Intervention Provide education on lifestyle modifcations including regular physical activity/exercise, weight management, moderate sodium restriction and increased consumption of fresh fruit, vegetables, and low fat dairy, alcohol moderation, and smoking cessation.;Monitor prescription use  compliance.    Expected Outcomes Short Term: Continued assessment and intervention until BP is < 140/31mm HG in hypertensive participants. < 130/18mm HG in hypertensive participants with diabetes, heart failure or chronic kidney disease.;Long Term: Maintenance of blood pressure at goal levels.    Lipids Yes    Intervention Provide education and support for participant on nutrition & aerobic/resistive exercise along with prescribed medications to achieve LDL 70mg , HDL >40mg .    Expected Outcomes Short Term: Participant states understanding of desired cholesterol values and is compliant with medications prescribed. Participant is following exercise prescription and nutrition guidelines.;Long Term: Cholesterol controlled with medications as prescribed, with individualized exercise RX and with personalized nutrition plan. Value goals: LDL < 70mg , HDL > 40 mg.             Education:Diabetes - Individual verbal and written instruction to review signs/symptoms of diabetes, desired ranges of glucose level fasting, after meals and with exercise. Acknowledge that pre and post exercise glucose checks will be done for 3 sessions at entry of program.   Core Components/Risk Factors/Patient Goals Review:    Core Components/Risk Factors/Patient Goals at Discharge (Final Review):    ITP Comments:  ITP Comments     Row Name 08/03/22 1547 08/14/22 1141         ITP Comments Initial phone call completed. Dx can be found in [facility] 5/14. EP orientation scheduled for 6/3 at 10:00 am. Completed and gym orientation. Initial ITP created and sent for review to Dr. Bethann Punches, Medical Director.  Pt wanted to talk to doctor about rehab prior to scheduling class/exercise time.  He will call to let us know.               Comments: Initial ITP

## 2022-08-14 NOTE — Progress Notes (Signed)
Cardiology Office Note:   Date:  08/14/2022  ID:  Kevin Clark, DOB 06-26-1980, MRN [id number] PCP: Lupita Dawn, MD  [facility] Providers Cardiologist:  Yvonne Kendall, MD    History of Present Illness:   Kevin Clark is a 42 y.o. male with a past medical history of hyperlipidemia, obesity, former smoker, intermittent alcohol use, who was recently seen in the hospital for evaluation of chest pain, who is here today to follow-up of his coronary artery disease.  He had presented to [facility] emergency department with complaints of chest pain radiating down his left arm that started the night before.  He denied any associated shortness of breath but did have some associated dizziness and diaphoresis.  He was only able to describe it as a different sensation in his chest and radiated into his left arm was hard to describe but states it did not feel like previous acid reflux that he suffers from routinely.  He stated he had taken some Tums and had gone to bed and at that point he believed it resolved because he was able to go to sleep.  Throughout the night he did have to get up and urinate on exertion of moving through the house going to the bathroom this sensation happened again.  The chest discomfort was resolved on evaluation in the emergency department.  Blood pressure was noted to be elevated on arrival at 162/119, high-sensitivity troponins trended up and peaked at 314.  He originally had been on nitroglycerin paste that had to be discontinued due to headache.  He was then taken for an elective heart catheterization.  Reviewed severe single-vessel coronary artery disease with thrombotic 99% stenosis of the inferior branch of OM1, 50 to 60% mid LAD disease and 20-30% stenosis of the mid/distal LMCA, normal left ventricular filling pressure, successful PCI/DES (Onyx Frontier 2.5 x 18 mm) to the inferior branch of OM1.  Dual antiplatelet therapy with aspirin and Brilinta for minimum of 12  months and aggressive secondary prevention of coronary artery disease.  Echocardiogram revealed LVEF of 55-60%, no regional wall motion abnormalities, trivial mitral regurgitation.  He previously been on simvastatin that was discontinued and he was placed on atorvastatin 80 mg daily he was continued on aspirin, Brilinta, and metoprolol.  He also had a referral that was made to cardiac rehab.  He was discharged from the facility on 07/27/2022.  He returns to clinic today accompanied by his wife. He states that he has had different feelings in his chest since discharge, but not a pain, no associated symptoms, just a feeling. It is not related to exertion. Denies any palpitations, shortness of breath, or peripheral edema. He has had his initial consult with cardiac rehab. Is working on dietary changes. Has been taking all of his medications. Is back to work but dealing with a little more stress than usual due to some automotive issues.   ROS: 10 point review of completed and considered negative with exception of what is listed in the HPI.  Studies Reviewed:    EKG: Normal sinus rhythm with a rate of 73 with no acute changes from prior study  [facility] 07/26/22 Conclusions: Severe single-vessel coronary artery disease with thrombotic 99% stenosis of inferior branch of OM1.  There is also 50-60% mid LAD disease and 20-30% stenosis of the mid/distal LMCA. Normal left ventricular filling pressure. Successful PCI to inferior branch of OM1 using Onyx Frontier 2.5 x 18 mm drug-eluting stent (postdilated to 2.8 mm) with 0% residual  stenosis and TIMI-3 flow.   Recommendations: Dual antiplatelet therapy with aspirin and ticagrelor for at least 12 months. Aggressive secondary prevention of coronary artery disease. Medical therapy of moderate LAD disease.  If the patient has recurrent chest pain, functional assessment to determine hemodynamic significance of LAD stenosis should be considered.  TTE 07/25/22 1. Left  ventricular ejection fraction, by estimation, is 55 to 60%. The  left ventricle has normal function. The left ventricle has no regional  wall motion abnormalities. Left ventricular diastolic parameters were  normal.   2. Right ventricular systolic function is normal. The right ventricular  size is normal.   3. The mitral valve is normal in structure. Trivial mitral valve  regurgitation. No evidence of mitral stenosis.   4. The aortic valve is tricuspid. Aortic valve regurgitation is not  visualized. No aortic stenosis is present.   5. The inferior vena cava is normal in size with greater than 50%  respiratory variability, suggesting right atrial pressure of 3 mmHg.    Risk Assessment/Calculations:              Physical Exam:   VS:  BP 136/88 (BP Location: Left Arm, Patient Position: Sitting, Cuff Size: Normal)   Pulse 73   Ht 5\' 5"  (1.651 m)   Wt 239 lb 3.2 oz (108.5 kg)   SpO2 99%   BMI 39.80 kg/m    Wt Readings from Last 3 Encounters:  08/14/22 239 lb 3.2 oz (108.5 kg)  08/14/22 236 lb 6.4 oz (107.2 kg)  07/26/22 235 lb (106.6 kg)     GEN: Well nourished, well developed in no acute distress NECK: No JVD; No carotid bruits CARDIAC: RRR, no murmurs, rubs, gallops RESPIRATORY:  Clear to auscultation without rales, wheezing or rhonchi  ABDOMEN: Soft, non-tender, non-distended EXTREMITIES:  No edema; No deformity   ASSESSMENT AND PLAN:   Coronary artery disease of the native coronary artery without angina with recent NSTEMI status post PCI to the inferior branch of OM1 on 07/26/2022.  Medical therapy was favored for moderate LAD disease with consideration for functional assessment to determine hemodynamic significance of LAD stenosis if he has recurrent angina.  He is continued on DAPT of aspirin 81 mg daily and Brilinta 90 mg twice daily.  He is also maintained on Toprol-XL 25 mg daily.  He is continued on atorvastatin with the changes made to therapy today.  He has had his  initial consult with cardiac rehab and has been encouraged to continue with some of cardiac rehab prior to going back to the gym.  Just to due to increased anxiety related to his recent diagnoses.  He was also prescribed Nitrostat 0.4 mg today and advised on side effects and indications for taking medication.  Continue with aggressive secondary prevention.  Mixed hyperlipidemia with an LDL of 131 with goal being less than 55.  LP(a) resulted at 195.55.  His hospitalization atorvastatin was uptitrated to 80 mg daily.  He has been reduced to 40 mg daily today with statin therapy causing an increase in LP(a) is also starting today PCSK9 inhibitor Repatha 140 mg autoinjector every 2 weeks.  Repeat lipid and hepatic panel in 2 months.  At that time we can look at adding Vascepa to his medical regimen as well.  Obesity with BMI of 39.80.  Is encouraged to walk and continue with dietary modifications.  Disposition patient to return to clinic to see MD/APP in 2 months or sooner if needed.  Signed, Camary Sosa, NP

## 2022-08-15 ENCOUNTER — Other Ambulatory Visit: Payer: Self-pay

## 2022-08-16 ENCOUNTER — Other Ambulatory Visit: Payer: Self-pay

## 2022-08-17 ENCOUNTER — Other Ambulatory Visit: Payer: Self-pay

## 2022-08-18 ENCOUNTER — Other Ambulatory Visit: Payer: Self-pay

## 2022-08-23 ENCOUNTER — Encounter: Payer: Self-pay | Admitting: *Deleted

## 2022-08-23 DIAGNOSIS — I214 Non-ST elevation (NSTEMI) myocardial infarction: Secondary | ICD-10-CM

## 2022-08-23 DIAGNOSIS — Z955 Presence of coronary angioplasty implant and graft: Secondary | ICD-10-CM

## 2022-08-23 NOTE — Progress Notes (Signed)
Cardiac Individual Treatment Plan  Patient Details  Name: Kevin Clark MRN: [id number] Date of Birth: 02/24/1981 Referring Provider:   Flowsheet Row Cardiac Rehab from 08/14/2022 in [facility] Cardiac and Pulmonary Rehab  Referring Provider End, Cristal Deer MD       Initial Encounter Date:  Flowsheet Row Cardiac Rehab from 08/14/2022 in [facility] Cardiac and Pulmonary Rehab  Date 08/14/22       Visit Diagnosis: NSTEMI (non-ST elevation myocardial infarction) [facility])  Status post coronary artery stent placement  Patient's Home Medications on Admission:  Current Outpatient Medications:    aspirin EC 81 MG tablet, Take 1 tablet (81 mg total) by mouth daily. Swallow whole., Disp: 30 tablet, Rfl: 12   atorvastatin (LIPITOR) 40 MG tablet, Take 1 tablet (40 mg total) by mouth daily., Disp: 90 tablet, Rfl: 3   atorvastatin (LIPITOR) 80 MG tablet, Take 1 tablet (80 mg total) by mouth daily., Disp: 30 tablet, Rfl: 3   Evolocumab (REPATHA SURECLICK) 140 MG/ML SOAJ, Inject 140 mg into the skin every 14 (fourteen) days., Disp: 2 mL, Rfl: 2   metoprolol succinate (TOPROL-XL) 25 MG 24 hr tablet, Take 1 tablet (25 mg total) by mouth daily., Disp: 90 tablet, Rfl: 3   nitroGLYCERIN (NITROSTAT) 0.4 MG SL tablet, Place 1 tablet (0.4 mg total) under the tongue every 5 (five) minutes as needed for chest pain., Disp: 25 tablet, Rfl: 2   ticagrelor (BRILINTA) 90 MG TABS tablet, Take 1 tablet (90 mg total) by mouth 2 (two) times daily., Disp: 180 tablet, Rfl: 3  Past Medical History: Past Medical History:  Diagnosis Date   Hyperlipidemia    Obesity    Palpitations     Tobacco Use: Social History   Tobacco Use  Smoking Status Some Days   Types: Cigars  Smokeless Tobacco Never    Labs: Review Flowsheet       Latest Ref Rng & Units 07/26/2022  Labs for ITP Cardiac and Pulmonary Rehab  Cholestrol 0 - 200 mg/dL 284   LDL (calc) 0 - 99 mg/dL 132   HDL-C >44 mg/dL 47   Trlycerides <010 mg/dL 272       Exercise Target Goals: Exercise Program Goal: Individual exercise prescription set using results from initial 6 min walk test and THRR while considering  patient's activity barriers and safety.   Exercise Prescription Goal: Initial exercise prescription builds to 30-45 minutes a day of aerobic activity, 2-3 days per week.  Home exercise guidelines will be given to patient during program as part of exercise prescription that the participant will acknowledge.   Education: Aerobic Exercise: - Group verbal and visual presentation on the components of exercise prescription. Introduces F.I.T.T principle from ACSM for exercise prescriptions.  Reviews F.I.T.T. principles of aerobic exercise including progression. Written material given at graduation. Flowsheet Row Cardiac Rehab from 08/14/2022 in [facility] Cardiac and Pulmonary Rehab  Education need identified 08/14/22       Education: Resistance Exercise: - Group verbal and visual presentation on the components of exercise prescription. Introduces F.I.T.T principle from ACSM for exercise prescriptions  Reviews F.I.T.T. principles of resistance exercise including progression. Written material given at graduation.    Education: Exercise & Equipment Safety: - Individual verbal instruction and demonstration of equipment use and safety with use of the equipment. Flowsheet Row Cardiac Rehab from 08/14/2022 in [facility] Cardiac and Pulmonary Rehab  Date 08/14/22  Educator [facility]  Instruction Review Code 1- Verbalizes Understanding       Education: Exercise Physiology & General  Exercise Guidelines: - Group verbal and written instruction with models to review the exercise physiology of the cardiovascular system and associated critical values. Provides general exercise guidelines with specific guidelines to those with heart or lung disease.    Education: Flexibility, Balance, Mind/Body Relaxation: - Group verbal and visual presentation with interactive  activity on the components of exercise prescription. Introduces F.I.T.T principle from ACSM for exercise prescriptions. Reviews F.I.T.T. principles of flexibility and balance exercise training including progression. Also discusses the mind body connection.  Reviews various relaxation techniques to help reduce and manage stress (i.e. Deep breathing, progressive muscle relaxation, and visualization). Balance handout provided to take home. Written material given at graduation.   Activity Barriers & Risk Stratification:  Activity Barriers & Cardiac Risk Stratification - 08/14/22 1142       Activity Barriers & Cardiac Risk Stratification   Activity Barriers None    Cardiac Risk Stratification Moderate             6 Minute Walk:  6 Minute Walk     Row Name 08/14/22 1141         6 Minute Walk   Phase Initial     Distance 70 feet     Walk Time 6 minutes     # of Rest Breaks 0     MPH 2.65     METS 3.99     RPE 7     VO2 Peak 13.98     Symptoms No     Resting HR 70 bpm     Resting BP 122/62     Resting Oxygen Saturation  98 %     Exercise Oxygen Saturation  during 6 min walk 96 %     Max Ex. HR 105 bpm     Max Ex. BP 134/66     2 Minute Post BP 134/70              Oxygen Initial Assessment:   Oxygen Re-Evaluation:   Oxygen Discharge (Final Oxygen Re-Evaluation):   Initial Exercise Prescription:  Initial Exercise Prescription - 08/14/22 1100       Date of Initial Exercise RX and Referring Provider   Date 08/14/22    Referring Provider End, Cristal Deer MD      Oxygen   Maintain Oxygen Saturation 88% or higher      Treadmill   MPH 2.7    Grade 2    Minutes 15    METs 3.81      Elliptical   Level 1    Speed 3.9    Minutes 15    METs 3.8      Prescription Details   Frequency (times per week) 3    Duration Progress to 30 minutes of continuous aerobic without signs/symptoms of physical distress      Intensity   THRR 40-80% of Max Heartrate 113-156     Ratings of Perceived Exertion 11-13    Perceived Dyspnea 0-4      Progression   Progression Continue to progress workloads to maintain intensity without signs/symptoms of physical distress.      Resistance Training   Training Prescription Yes    Weight 7 lb    Reps 10-15             Perform Capillary Blood Glucose checks as needed.  Exercise Prescription Changes:   Exercise Prescription Changes     Row Name 08/14/22 1100             Response  to Exercise   Blood Pressure (Admit) 122/62       Blood Pressure (Exercise) 134/66       Blood Pressure (Exit) 142/74  recheck 134/70       Heart Rate (Admit) 70 bpm       Heart Rate (Exercise) 105 bpm       Heart Rate (Exit) 75 bpm       Oxygen Saturation (Admit) 98 %       Oxygen Saturation (Exercise) 96 %       Rating of Perceived Exertion (Exercise) 7       Symptoms none       Comments walk test results                Exercise Comments:   Exercise Goals and Review:   Exercise Goals     Row Name 08/14/22 1144             Exercise Goals   Increase Physical Activity Yes       Intervention Provide advice, education, support and counseling about physical activity/exercise needs.;Develop an individualized exercise prescription for aerobic and resistive training based on initial evaluation findings, risk stratification, comorbidities and participant's personal goals.       Expected Outcomes Short Term: Attend rehab on a regular basis to increase amount of physical activity.;Long Term: Exercising regularly at least 3-5 days a week.;Long Term: Add in home exercise to make exercise part of routine and to increase amount of physical activity.       Increase Strength and Stamina Yes       Intervention Provide advice, education, support and counseling about physical activity/exercise needs.;Develop an individualized exercise prescription for aerobic and resistive training based on initial evaluation findings, risk  stratification, comorbidities and participant's personal goals.       Expected Outcomes Short Term: Increase workloads from initial exercise prescription for resistance, speed, and METs.;Short Term: Perform resistance training exercises routinely during rehab and add in resistance training at home;Long Term: Improve cardiorespiratory fitness, muscular endurance and strength as measured by increased METs and functional capacity ( )       Able to understand and use rate of perceived exertion (RPE) scale Yes       Intervention Provide education and explanation on how to use RPE scale       Expected Outcomes Short Term: Able to use RPE daily in rehab to express subjective intensity level;Long Term:  Able to use RPE to guide intensity level when exercising independently       Able to understand and use Dyspnea scale Yes       Intervention Provide education and explanation on how to use Dyspnea scale       Expected Outcomes Short Term: Able to use Dyspnea scale daily in rehab to express subjective sense of shortness of breath during exertion;Long Term: Able to use Dyspnea scale to guide intensity level when exercising independently       Knowledge and understanding of Target Heart Rate Range (THRR) Yes       Intervention Provide education and explanation of THRR including how the numbers were predicted and where they are located for reference       Expected Outcomes Short Term: Able to state/look up THRR;Short Term: Able to use daily as guideline for intensity in rehab;Long Term: Able to use THRR to govern intensity when exercising independently       Able to check pulse independently Yes  Intervention Provide education and demonstration on how to check pulse in carotid and radial arteries.;Review the importance of being able to check your own pulse for safety during independent exercise       Expected Outcomes Long Term: Able to check pulse independently and accurately;Short Term: Able to explain why  pulse checking is important during independent exercise       Understanding of Exercise Prescription Yes       Intervention Provide education, explanation, and written materials on patient's individual exercise prescription       Expected Outcomes Short Term: Able to explain program exercise prescription;Long Term: Able to explain home exercise prescription to exercise independently                Exercise Goals Re-Evaluation :   Discharge Exercise Prescription (Final Exercise Prescription Changes):  Exercise Prescription Changes - 08/14/22 1100       Response to Exercise   Blood Pressure (Admit) 122/62    Blood Pressure (Exercise) 134/66    Blood Pressure (Exit) 142/74   recheck 134/70   Heart Rate (Admit) 70 bpm    Heart Rate (Exercise) 105 bpm    Heart Rate (Exit) 75 bpm    Oxygen Saturation (Admit) 98 %    Oxygen Saturation (Exercise) 96 %    Rating of Perceived Exertion (Exercise) 7    Symptoms none    Comments walk test results             Nutrition:  Target Goals: Understanding of nutrition guidelines, daily intake of sodium 1500mg , cholesterol 200mg , calories 30% from fat and 7% or less from saturated fats, daily to have 5 or more servings of fruits and vegetables.  Education: All About Nutrition: -Group instruction provided by verbal, written material, interactive activities, discussions, models, and posters to present general guidelines for heart healthy nutrition including fat, fiber, MyPlate, the role of sodium in heart healthy nutrition, utilization of the nutrition label, and utilization of this knowledge for meal planning. Follow up email sent as well. Written material given at graduation. Flowsheet Row Cardiac Rehab from 08/14/2022 in [facility] Cardiac and Pulmonary Rehab  Education need identified 08/14/22       Biometrics:  Pre Biometrics - 08/14/22 1144       Pre Biometrics   Height 5' 5.5" (1.664 m)    Weight 236 lb 6.4 oz (107.2 kg)    Waist  Circumference 45 inches    Hip Circumference 42 inches    Waist to Hip Ratio 1.07 %    BMI (Calculated) 38.73    Single Leg Stand 30 seconds              Nutrition Therapy Plan and Nutrition Goals:  Nutrition Therapy & Goals - 08/14/22 1145       Intervention Plan   Intervention Prescribe, educate and counsel regarding individualized specific dietary modifications aiming towards targeted core components such as weight, hypertension, lipid management, diabetes, heart failure and other comorbidities.    Expected Outcomes Short Term Goal: Understand basic principles of dietary content, such as calories, fat, sodium, cholesterol and nutrients.;Short Term Goal: A plan has been developed with personal nutrition goals set during dietitian appointment.;Long Term Goal: Adherence to prescribed nutrition plan.             Nutrition Assessments:  MEDIFICTS Score Key: ?70 Need to make dietary changes  40-70 Heart Healthy Diet ? 40 Therapeutic Level Cholesterol Diet  Flowsheet Row Cardiac Rehab from 08/14/2022 in [facility] Cardiac  and Pulmonary Rehab  Picture Your Plate Total Score on Admission 57      Picture Your Plate Scores: <54 Unhealthy dietary pattern with much room for improvement. 41-50 Dietary pattern unlikely to meet recommendations for good health and room for improvement. 51-60 More healthful dietary pattern, with some room for improvement.  >60 Healthy dietary pattern, although there may be some specific behaviors that could be improved.    Nutrition Goals Re-Evaluation:   Nutrition Goals Discharge (Final Nutrition Goals Re-Evaluation):   Psychosocial: Target Goals: Acknowledge presence or absence of significant depression and/or stress, maximize coping skills, provide positive support system. Participant is able to verbalize types and ability to use techniques and skills needed for reducing stress and depression.   Education: Stress, Anxiety, and Depression - Group  verbal and visual presentation to define topics covered.  Reviews how body is impacted by stress, anxiety, and depression.  Also discusses healthy ways to reduce stress and to treat/manage anxiety and depression.  Written material given at graduation.   Education: Sleep Hygiene -Provides group verbal and written instruction about how sleep can affect your health.  Define sleep hygiene, discuss sleep cycles and impact of sleep habits. Review good sleep hygiene tips.    Initial Review & Psychosocial Screening:  Initial Psych Review & Screening - 08/03/22 1550       Initial Review   Current issues with None Identified      Family Dynamics   Good Support System? Yes   Wife            Quality of Life Scores:   Quality of Life - 08/14/22 1145       Quality of Life   Select Quality of Life      Quality of Life Scores   Health/Function Pre 21.02 %    Socioeconomic Pre 23.29 %    Psych/Spiritual Pre 17.79 %    Family Pre 25.2 %    GLOBAL Pre 21.51 %            Scores of 19 and below usually indicate a poorer quality of life in these areas.  A difference of  2-3 points is a clinically meaningful difference.  A difference of 2-3 points in the total score of the Quality of Life Index has been associated with significant improvement in overall quality of life, self-image, physical symptoms, and general health in studies assessing change in quality of life.  PHQ-9: Review Flowsheet       08/14/2022  Depression screen PHQ 2/9  Decreased Interest 0  Down, Depressed, Hopeless 1  PHQ - 2 Score 1  Altered sleeping 0  Tired, decreased energy 0  Change in appetite 0  Feeling bad or failure about yourself  1  Trouble concentrating 0  Moving slowly or fidgety/restless 0  Suicidal thoughts 0  PHQ-9 Score 2  Difficult doing work/chores Not difficult at all   Interpretation of Total Score  Total Score Depression Severity:  1-4 = Minimal depression, 5-9 = Mild depression, 10-14 =  Moderate depression, 15-19 = Moderately severe depression, 20-27 = Severe depression   Psychosocial Evaluation and Intervention:  Psychosocial Evaluation - 08/03/22 1550       Psychosocial Evaluation & Interventions   Comments Seena "Rocco" is coming to cardiac rehab post NSTEMI w/ stent. He reports no concerns with stress and he is back to work. He reports a good support system including his wife. He is feeling back to baseline and has no barriers to attending  the program.    Expected Outcomes Short: Attend cardiac rehab for education and exercise. Long: Develop and maintain positive self care habits.    Continue Psychosocial Services  Follow up required by staff             Psychosocial Re-Evaluation:   Psychosocial Discharge (Final Psychosocial Re-Evaluation):   Vocational Rehabilitation: Provide vocational rehab assistance to qualifying candidates.   Vocational Rehab Evaluation & Intervention:  Vocational Rehab - 08/03/22 1550       Initial Vocational Rehab Evaluation & Intervention   Assessment shows need for Vocational Rehabilitation No             Education: Education Goals: Education classes will be provided on a variety of topics geared toward better understanding of heart health and risk factor modification. Participant will state understanding/return demonstration of topics presented as noted by education test scores.  Learning Barriers/Preferences:  Learning Barriers/Preferences - 08/03/22 1550       Learning Barriers/Preferences   Learning Barriers None    Learning Preferences None             General Cardiac Education Topics:  AED/CPR: - Group verbal and written instruction with the use of models to demonstrate the basic use of the AED with the basic ABC's of resuscitation.   Anatomy and Cardiac Procedures: - Group verbal and visual presentation and models provide information about basic cardiac anatomy and function. Reviews the testing  methods done to diagnose heart disease and the outcomes of the test results. Describes the treatment choices: Medical Management, Angioplasty, or Coronary Bypass Surgery for treating various heart conditions including Myocardial Infarction, Angina, Valve Disease, and Cardiac Arrhythmias.  Written material given at graduation. Flowsheet Row Cardiac Rehab from 08/14/2022 in [facility] Cardiac and Pulmonary Rehab  Education need identified 08/14/22       Medication Safety: - Group verbal and visual instruction to review commonly prescribed medications for heart and lung disease. Reviews the medication, class of the drug, and side effects. Includes the steps to properly store meds and maintain the prescription regimen.  Written material given at graduation.   Intimacy: - Group verbal instruction through game format to discuss how heart and lung disease can affect sexual intimacy. Written material given at graduation..   Know Your Numbers and Heart Failure: - Group verbal and visual instruction to discuss disease risk factors for cardiac and pulmonary disease and treatment options.  Reviews associated critical values for Overweight/Obesity, Hypertension, Cholesterol, and Diabetes.  Discusses basics of heart failure: signs/symptoms and treatments.  Introduces Heart Failure Zone chart for action plan for heart failure.  Written material given at graduation.   Infection Prevention: - Provides verbal and written material to individual with discussion of infection control including proper hand washing and proper equipment cleaning during exercise session. Flowsheet Row Cardiac Rehab from 08/14/2022 in [facility] Cardiac and Pulmonary Rehab  Date 08/14/22  Educator [facility]  Instruction Review Code 1- Verbalizes Understanding       Falls Prevention: - Provides verbal and written material to individual with discussion of falls prevention and safety. Flowsheet Row Cardiac Rehab from 08/14/2022 in [facility] Cardiac and  Pulmonary Rehab  Date 08/14/22  Educator [facility]  Instruction Review Code 1- Verbalizes Understanding       Other: -Provides group and verbal instruction on various topics (see comments)   Knowledge Questionnaire Score:  Knowledge Questionnaire Score - 08/14/22 1146       Knowledge Questionnaire Score   Pre Score 20/26  Core Components/Risk Factors/Patient Goals at Admission:  Personal Goals and Risk Factors at Admission - 08/14/22 1146       Core Components/Risk Factors/Patient Goals on Admission    Weight Management Yes;Obesity;Weight Loss    Intervention Weight Management: Develop a combined nutrition and exercise program designed to reach desired caloric intake, while maintaining appropriate intake of nutrient and fiber, sodium and fats, and appropriate energy expenditure required for the weight goal.;Weight Management: Provide education and appropriate resources to help participant work on and attain dietary goals.;Weight Management/Obesity: Establish reasonable short term and long term weight goals.;Obesity: Provide education and appropriate resources to help participant work on and attain dietary goals.    Admit Weight 236 lb 6.4 oz (107.2 kg)    Goal Weight: Short Term 230 lb (104.3 kg)    Goal Weight: Long Term 225 lb (102.1 kg)    Expected Outcomes Short Term: Continue to assess and modify interventions until short term weight is achieved;Long Term: Adherence to nutrition and physical activity/exercise program aimed toward attainment of established weight goal;Weight Loss: Understanding of general recommendations for a balanced deficit meal plan, which promotes 1-2 lb weight loss per week and includes a negative energy balance of [phone] kcal/d;Understanding recommendations for meals to include 15-35% energy as protein, 25-35% energy from fat, 35-60% energy from carbohydrates, less than 200mg  of dietary cholesterol, 20-35 gm of total fiber daily;Understanding of  distribution of calorie intake throughout the day with the consumption of 4-5 meals/snacks    Hypertension Yes    Intervention Provide education on lifestyle modifcations including regular physical activity/exercise, weight management, moderate sodium restriction and increased consumption of fresh fruit, vegetables, and low fat dairy, alcohol moderation, and smoking cessation.;Monitor prescription use compliance.    Expected Outcomes Short Term: Continued assessment and intervention until BP is < 140/34mm HG in hypertensive participants. < 130/55mm HG in hypertensive participants with diabetes, heart failure or chronic kidney disease.;Long Term: Maintenance of blood pressure at goal levels.    Lipids Yes    Intervention Provide education and support for participant on nutrition & aerobic/resistive exercise along with prescribed medications to achieve LDL 70mg , HDL >40mg .    Expected Outcomes Short Term: Participant states understanding of desired cholesterol values and is compliant with medications prescribed. Participant is following exercise prescription and nutrition guidelines.;Long Term: Cholesterol controlled with medications as prescribed, with individualized exercise RX and with personalized nutrition plan. Value goals: LDL < 70mg , HDL > 40 mg.             Education:Diabetes - Individual verbal and written instruction to review signs/symptoms of diabetes, desired ranges of glucose level fasting, after meals and with exercise. Acknowledge that pre and post exercise glucose checks will be done for 3 sessions at entry of program.   Core Components/Risk Factors/Patient Goals Review:    Core Components/Risk Factors/Patient Goals at Discharge (Final Review):    ITP Comments:  ITP Comments     Row Name 08/03/22 1547 08/14/22 1141 08/23/22 1126       ITP Comments Initial phone call completed. Dx can be found in [facility] 5/14. EP orientation scheduled for 6/3 at 10:00 am. Completed and  gym orientation. Initial ITP created and sent for review to Dr. Bethann Punches, Medical Director.  Pt wanted to talk to doctor about rehab prior to scheduling class/exercise time.  He will call to let us know. 30 Day review completed. Medical Director ITP review done, changes made as directed, and signed approval by Medical Director.  new to program              Comments:

## 2022-09-04 ENCOUNTER — Encounter: Payer: 59 | Admitting: *Deleted

## 2022-09-04 DIAGNOSIS — Z955 Presence of coronary angioplasty implant and graft: Secondary | ICD-10-CM | POA: Diagnosis not present

## 2022-09-04 DIAGNOSIS — I214 Non-ST elevation (NSTEMI) myocardial infarction: Secondary | ICD-10-CM

## 2022-09-04 NOTE — Progress Notes (Signed)
Daily Session Note  Patient Details  Name: Kevin Clark MRN: [id number] Date of Birth: [birth year] Referring Provider:   Flowsheet Row Cardiac Rehab from 08/14/2022 in [facility] Cardiac and Pulmonary Rehab  Referring Provider End, Cristal Deer MD       Encounter Date: 09/04/2022  Check In:  Session Check In - 09/04/22 0755       Check-In   Supervising physician immediately available to respond to emergencies See telemetry face sheet for immediately available ER MD    Location [facility]-Cardiac & Pulmonary Rehab    Staff Present Lanny Hurst, RN, Franki Monte, BS, ACSM CEP, Exercise Physiologist;Kristen Coble, RN,BC,MSN    Virtual Visit No    Medication changes reported     No    Fall or balance concerns reported    No    Warm-up and Cool-down Performed on first and last piece of equipment    Resistance Training Performed Yes    VAD Patient? No    PAD/SET Patient? No      Pain Assessment   Currently in Pain? No/denies                Social History   Tobacco Use  Smoking Status Some Days   Types: Cigars  Smokeless Tobacco Never    Goals Met:  Independence with exercise equipment Exercise tolerated well No report of concerns or symptoms today Strength training completed today  Goals Unmet:  Not Applicable  Comments: First full day of exercise!  Patient was oriented to gym and equipment including functions, settings, policies, and procedures.  Patient's individual exercise prescription and treatment plan were reviewed.  All starting workloads were established based on the results of the 6 minute walk test done at initial orientation visit.  The plan for exercise progression was also introduced and progression will be customized based on patient's performance and goals.    Dr. Bethann Punches is Medical Director for [facility] Cardiac Rehabilitation.  Dr. Vida Rigger is Medical Director for [facility] Pulmonary Rehabilitation.

## 2022-09-06 ENCOUNTER — Encounter: Payer: 59 | Admitting: *Deleted

## 2022-09-06 DIAGNOSIS — I214 Non-ST elevation (NSTEMI) myocardial infarction: Secondary | ICD-10-CM | POA: Diagnosis not present

## 2022-09-06 DIAGNOSIS — Z955 Presence of coronary angioplasty implant and graft: Secondary | ICD-10-CM | POA: Diagnosis not present

## 2022-09-06 NOTE — Progress Notes (Signed)
Daily Session Note  Patient Details  Name: Tycen Dockter MRN: [id number] Date of Birth: 03/09/1981 Referring Provider:   Flowsheet Row Cardiac Rehab from 08/14/2022 in [facility] Cardiac and Pulmonary Rehab  Referring Provider End, Cristal Deer MD       Encounter Date: 09/06/2022  Check In:  Session Check In - 09/06/22 0754       Check-In   Supervising physician immediately available to respond to emergencies See telemetry face sheet for immediately available ER MD    Location [facility]-Cardiac & Pulmonary Rehab    Staff Present Lanny Hurst, RN, ADN;Kristen Coble, RN,BC,MSN;Noah Tickle, BS, Exercise Physiologist    Virtual Visit No    Medication changes reported     No    Fall or balance concerns reported    No    Warm-up and Cool-down Performed on first and last piece of equipment    Resistance Training Performed Yes    VAD Patient? No    PAD/SET Patient? No      Pain Assessment   Currently in Pain? No/denies                Social History   Tobacco Use  Smoking Status Some Days   Types: Cigars  Smokeless Tobacco Never    Goals Met:  Independence with exercise equipment Exercise tolerated well No report of concerns or symptoms today Strength training completed today  Goals Unmet:  Not Applicable  Comments: Pt able to follow exercise prescription today without complaint.  Will continue to monitor for progression.    Dr. Bethann Punches is Medical Director for [facility] Cardiac Rehabilitation.  Dr. Vida Rigger is Medical Director for [facility] Pulmonary Rehabilitation.

## 2022-09-08 DIAGNOSIS — Z955 Presence of coronary angioplasty implant and graft: Secondary | ICD-10-CM | POA: Diagnosis not present

## 2022-09-08 DIAGNOSIS — I214 Non-ST elevation (NSTEMI) myocardial infarction: Secondary | ICD-10-CM | POA: Diagnosis not present

## 2022-09-08 NOTE — Progress Notes (Signed)
Daily Session Note  Patient Details  Name: Keymani Betten MRN: [id number] Date of Birth: Sep 25, 1980 Referring Provider:   Flowsheet Row Cardiac Rehab from 08/14/2022 in [facility] Cardiac and Pulmonary Rehab  Referring Provider End, Cristal Deer MD       Encounter Date: 09/08/2022  Check In:  Session Check In - 09/08/22 0752       Check-In   Supervising physician immediately available to respond to emergencies See telemetry face sheet for immediately available ER MD    Location [facility]-Cardiac & Pulmonary Rehab    Staff Present Darcel Bayley, RN,BC,MSN;Joseph Nora, RCP,RRT,BSRT;Kelly Taft, [place], ACSM CEP, Exercise Physiologist    Virtual Visit No    Medication changes reported     No    Fall or balance concerns reported    No    Warm-up and Cool-down Performed on first and last piece of equipment    Resistance Training Performed Yes    VAD Patient? No    PAD/SET Patient? No      Pain Assessment   Currently in Pain? No/denies    Multiple Pain Sites No                Social History   Tobacco Use  Smoking Status Some Days   Types: Cigars  Smokeless Tobacco Never    Goals Met:  Independence with exercise equipment Exercise tolerated well No report of concerns or symptoms today Strength training completed today  Goals Unmet:  Not Applicable  Comments: Pt able to follow exercise prescription today without complaint.  Will continue to monitor for progression.    Dr. Bethann Punches is Medical Director for [facility] Cardiac Rehabilitation.  Dr. Vida Rigger is Medical Director for [facility] Pulmonary Rehabilitation.

## 2022-09-11 ENCOUNTER — Encounter: Payer: 59 | Attending: Internal Medicine | Admitting: *Deleted

## 2022-09-11 DIAGNOSIS — I214 Non-ST elevation (NSTEMI) myocardial infarction: Secondary | ICD-10-CM | POA: Diagnosis not present

## 2022-09-11 DIAGNOSIS — Z955 Presence of coronary angioplasty implant and graft: Secondary | ICD-10-CM

## 2022-09-11 NOTE — Progress Notes (Signed)
Daily Session Note  Patient Details  Name: Kevin Clark MRN: [id number] Date of Birth: March 22, 1980 Referring Provider:   Flowsheet Row Cardiac Rehab from 08/14/2022 in [facility] Cardiac and Pulmonary Rehab  Referring Provider End, Cristal Deer MD       Encounter Date: 09/11/2022  Check In:  Session Check In - 09/11/22 0740       Check-In   Supervising physician immediately available to respond to emergencies See telemetry face sheet for immediately available ER MD    Location [facility]-Cardiac & Pulmonary Rehab    Staff Present Lanny Hurst, RN, ADN;Denise Rhew, PhD, RN, CNS, CEN;Other   [place], MS   Virtual Visit No    Medication changes reported     No    Fall or balance concerns reported    No    Warm-up and Cool-down Performed on first and last piece of equipment    Resistance Training Performed Yes    VAD Patient? No    PAD/SET Patient? No      Pain Assessment   Currently in Pain? No/denies                Social History   Tobacco Use  Smoking Status Some Days   Types: Cigars  Smokeless Tobacco Never    Goals Met:  Independence with exercise equipment Exercise tolerated well No report of concerns or symptoms today Strength training completed today  Goals Unmet:  Not Applicable  Comments: Pt able to follow exercise prescription today without complaint.  Will continue to monitor for progression.    Dr. Bethann Punches is Medical Director for [facility] Cardiac Rehabilitation.  Dr. Vida Rigger is Medical Director for [facility] Pulmonary Rehabilitation.

## 2022-09-13 ENCOUNTER — Encounter: Payer: 59 | Admitting: *Deleted

## 2022-09-13 DIAGNOSIS — I214 Non-ST elevation (NSTEMI) myocardial infarction: Secondary | ICD-10-CM

## 2022-09-13 DIAGNOSIS — Z955 Presence of coronary angioplasty implant and graft: Secondary | ICD-10-CM

## 2022-09-13 NOTE — Progress Notes (Signed)
Daily Session Note  Patient Details  Name: Kevin Clark MRN: [id number] Date of Birth: 07-07-1980 Referring Provider:   Flowsheet Row Cardiac Rehab from 08/14/2022 in [facility] Cardiac and Pulmonary Rehab  Referring Provider End, Cristal Deer MD       Encounter Date: 09/13/2022  Check In:  Session Check In - 09/13/22 0735       Check-In   Supervising physician immediately available to respond to emergencies See telemetry face sheet for immediately available ER MD    Location [facility]-Cardiac & Pulmonary Rehab    Staff Present Lanny Hurst, RN, ADN;Joseph Reino [place], RCP,RRT,BSRT;Kristen Coble, RN,BC,MSN    Virtual Visit No    Medication changes reported     No    Fall or balance concerns reported    No    Warm-up and Cool-down Performed on first and last piece of equipment    Resistance Training Performed Yes    VAD Patient? No    PAD/SET Patient? No      Pain Assessment   Currently in Pain? No/denies                Social History   Tobacco Use  Smoking Status Some Days   Types: Cigars  Smokeless Tobacco Never    Goals Met:  Independence with exercise equipment Exercise tolerated well No report of concerns or symptoms today Strength training completed today  Goals Unmet:  Not Applicable  Comments: Pt able to follow exercise prescription today without complaint.  Will continue to monitor for progression.    Dr. Bethann Punches is Medical Director for [facility] Cardiac Rehabilitation.  Dr. Vida Rigger is Medical Director for [facility] Pulmonary Rehabilitation.

## 2022-09-15 ENCOUNTER — Encounter: Payer: 59 | Admitting: *Deleted

## 2022-09-15 DIAGNOSIS — I214 Non-ST elevation (NSTEMI) myocardial infarction: Secondary | ICD-10-CM

## 2022-09-15 DIAGNOSIS — Z955 Presence of coronary angioplasty implant and graft: Secondary | ICD-10-CM

## 2022-09-15 NOTE — Progress Notes (Signed)
Daily Session Note  Patient Details  Name: Kevin Clark MRN: [id number] Date of Birth: [birth year] Referring Provider:   Flowsheet Row Cardiac Rehab from 08/14/2022 in [facility] Cardiac and Pulmonary Rehab  Referring Provider End, Cristal Deer MD       Encounter Date: 09/15/2022  Check In:  Session Check In - 09/15/22 0837       Check-In   Supervising physician immediately available to respond to emergencies See telemetry face sheet for immediately available ER MD    Location [facility]-Cardiac & Pulmonary Rehab    Staff Present Cora Collum, RN, BSN, CCRP;Noah Tickle, BS, Exercise Physiologist;Other   [place] Bigelow HFS   Virtual Visit No    Medication changes reported     No    Fall or balance concerns reported    No    Warm-up and Cool-down Performed on first and last piece of equipment    Resistance Training Performed Yes    VAD Patient? No    PAD/SET Patient? No      Pain Assessment   Currently in Pain? No/denies                Social History   Tobacco Use  Smoking Status Some Days   Types: Cigars  Smokeless Tobacco Never    Goals Met:  Independence with exercise equipment Exercise tolerated well No report of concerns or symptoms today  Goals Unmet:  Not Applicable  Comments: Pt able to follow exercise prescription today without complaint.  Will continue to monitor for progression.    Dr. Bethann Punches is Medical Director for [facility] Cardiac Rehabilitation.  Dr. Vida Rigger is Medical Director for [facility] Pulmonary Rehabilitation.

## 2022-09-18 ENCOUNTER — Encounter: Payer: 59 | Admitting: *Deleted

## 2022-09-18 DIAGNOSIS — I214 Non-ST elevation (NSTEMI) myocardial infarction: Secondary | ICD-10-CM

## 2022-09-18 DIAGNOSIS — Z955 Presence of coronary angioplasty implant and graft: Secondary | ICD-10-CM

## 2022-09-18 NOTE — Progress Notes (Signed)
Daily Session Note  Patient Details  Name: Kevin Clark MRN: [id number] Date of Birth: [birth year] Referring Provider:   Flowsheet Row Cardiac Rehab from 08/14/2022 in [facility] Cardiac and Pulmonary Rehab  Referring Provider End, Cristal Deer MD       Encounter Date: 09/18/2022  Check In:  Session Check In - 09/18/22 1117       Check-In   Supervising physician immediately available to respond to emergencies See telemetry face sheet for immediately available ER MD    Location [facility]-Cardiac & Pulmonary Rehab    Staff Present Cora Collum, RN, BSN, Matthew Saras, BS, ACSM CEP, Exercise Physiologist;Krista Karleen Hampshire RN, BSN    Virtual Visit No    Medication changes reported     No    Fall or balance concerns reported    No    Warm-up and Cool-down Performed on first and last piece of equipment    Resistance Training Performed Yes    VAD Patient? No    PAD/SET Patient? No      Pain Assessment   Currently in Pain? No/denies                Social History   Tobacco Use  Smoking Status Some Days   Types: Cigars  Smokeless Tobacco Never    Goals Met:  Independence with exercise equipment Exercise tolerated well No report of concerns or symptoms today  Goals Unmet:  Not Applicable  Comments: Pt able to follow exercise prescription today without complaint.  Will continue to monitor for progression.    Dr. Bethann Punches is Medical Director for [facility] Cardiac Rehabilitation.  Dr. Vida Rigger is Medical Director for [facility] Pulmonary Rehabilitation.

## 2022-09-19 ENCOUNTER — Encounter: Payer: Self-pay | Admitting: *Deleted

## 2022-09-19 DIAGNOSIS — I214 Non-ST elevation (NSTEMI) myocardial infarction: Secondary | ICD-10-CM

## 2022-09-19 DIAGNOSIS — Z955 Presence of coronary angioplasty implant and graft: Secondary | ICD-10-CM

## 2022-09-19 NOTE — Progress Notes (Signed)
Cardiac Individual Treatment Plan  Patient Details  Name: Kevin Clark MRN: [id number] Date of Birth: 1980-05-14 Referring Provider:   Flowsheet Row Cardiac Rehab from 08/14/2022 in [facility] Cardiac and Pulmonary Rehab  Referring Provider End, Cristal Deer MD       Initial Encounter Date:  Flowsheet Row Cardiac Rehab from 08/14/2022 in [facility] Cardiac and Pulmonary Rehab  Date 08/14/22       Visit Diagnosis: NSTEMI (non-ST elevation myocardial infarction) [facility])  Status post coronary artery stent placement  Patient's Home Medications on Admission:  Current Outpatient Medications:    aspirin EC 81 MG tablet, Take 1 tablet (81 mg total) by mouth daily. Swallow whole., Disp: 30 tablet, Rfl: 12   atorvastatin (LIPITOR) 40 MG tablet, Take 1 tablet (40 mg total) by mouth daily., Disp: 90 tablet, Rfl: 3   atorvastatin (LIPITOR) 80 MG tablet, Take 1 tablet (80 mg total) by mouth daily., Disp: 30 tablet, Rfl: 3   Evolocumab (REPATHA SURECLICK) 140 MG/ML SOAJ, Inject 140 mg into the skin every 14 (fourteen) days., Disp: 2 mL, Rfl: 2   metoprolol succinate (TOPROL-XL) 25 MG 24 hr tablet, Take 1 tablet (25 mg total) by mouth daily., Disp: 90 tablet, Rfl: 3   nitroGLYCERIN (NITROSTAT) 0.4 MG SL tablet, Place 1 tablet (0.4 mg total) under the tongue every 5 (five) minutes as needed for chest pain., Disp: 25 tablet, Rfl: 2   ticagrelor (BRILINTA) 90 MG TABS tablet, Take 1 tablet (90 mg total) by mouth 2 (two) times daily., Disp: 180 tablet, Rfl: 3  Past Medical History: Past Medical History:  Diagnosis Date   Hyperlipidemia    Obesity    Palpitations     Tobacco Use: Social History   Tobacco Use  Smoking Status Some Days   Types: Cigars  Smokeless Tobacco Never    Labs: Review Flowsheet       Latest Ref Rng & Units 07/26/2022  Labs for ITP Cardiac and Pulmonary Rehab  Cholestrol 0 - 200 mg/dL 409   LDL (calc) 0 - 99 mg/dL 811   HDL-C >91 mg/dL 47   Trlycerides <478 mg/dL 295       Exercise Target Goals: Exercise Program Goal: Individual exercise prescription set using results from initial 6 min walk test and THRR while considering  patient's activity barriers and safety.   Exercise Prescription Goal: Initial exercise prescription builds to 30-45 minutes a day of aerobic activity, 2-3 days per week.  Home exercise guidelines will be given to patient during program as part of exercise prescription that the participant will acknowledge.   Education: Aerobic Exercise: - Group verbal and visual presentation on the components of exercise prescription. Introduces F.I.T.T principle from ACSM for exercise prescriptions.  Reviews F.I.T.T. principles of aerobic exercise including progression. Written material given at graduation. Flowsheet Row Cardiac Rehab from 09/06/2022 in [facility] Cardiac and Pulmonary Rehab  Education need identified 08/14/22       Education: Resistance Exercise: - Group verbal and visual presentation on the components of exercise prescription. Introduces F.I.T.T principle from ACSM for exercise prescriptions  Reviews F.I.T.T. principles of resistance exercise including progression. Written material given at graduation.    Education: Exercise & Equipment Safety: - Individual verbal instruction and demonstration of equipment use and safety with use of the equipment. Flowsheet Row Cardiac Rehab from 09/06/2022 in [facility] Cardiac and Pulmonary Rehab  Date 08/14/22  Educator [facility]  Instruction Review Code 1- Verbalizes Understanding       Education: Exercise Physiology & General  Exercise Guidelines: - Group verbal and written instruction with models to review the exercise physiology of the cardiovascular system and associated critical values. Provides general exercise guidelines with specific guidelines to those with heart or lung disease.    Education: Flexibility, Balance, Mind/Body Relaxation: - Group verbal and visual presentation with interactive  activity on the components of exercise prescription. Introduces F.I.T.T principle from ACSM for exercise prescriptions. Reviews F.I.T.T. principles of flexibility and balance exercise training including progression. Also discusses the mind body connection.  Reviews various relaxation techniques to help reduce and manage stress (i.e. Deep breathing, progressive muscle relaxation, and visualization). Balance handout provided to take home. Written material given at graduation.   Activity Barriers & Risk Stratification:  Activity Barriers & Cardiac Risk Stratification - 08/14/22 1142       Activity Barriers & Cardiac Risk Stratification   Activity Barriers None    Cardiac Risk Stratification Moderate             6 Minute Walk:  6 Minute Walk     Row Name 08/14/22 1141         6 Minute Walk   Phase Initial     Distance 70 feet     Walk Time 6 minutes     # of Rest Breaks 0     MPH 2.65     METS 3.99     RPE 7     VO2 Peak 13.98     Symptoms No     Resting HR 70 bpm     Resting BP 122/62     Resting Oxygen Saturation  98 %     Exercise Oxygen Saturation  during 6 min walk 96 %     Max Ex. HR 105 bpm     Max Ex. BP 134/66     2 Minute Post BP 134/70              Oxygen Initial Assessment:   Oxygen Re-Evaluation:   Oxygen Discharge (Final Oxygen Re-Evaluation):   Initial Exercise Prescription:  Initial Exercise Prescription - 08/14/22 1100       Date of Initial Exercise RX and Referring Provider   Date 08/14/22    Referring Provider End, Cristal Deer MD      Oxygen   Maintain Oxygen Saturation 88% or higher      Treadmill   MPH 2.7    Grade 2    Minutes 15    METs 3.81      Elliptical   Level 1    Speed 3.9    Minutes 15    METs 3.8      Prescription Details   Frequency (times per week) 3    Duration Progress to 30 minutes of continuous aerobic without signs/symptoms of physical distress      Intensity   THRR 40-80% of Max Heartrate 113-156     Ratings of Perceived Exertion 11-13    Perceived Dyspnea 0-4      Progression   Progression Continue to progress workloads to maintain intensity without signs/symptoms of physical distress.      Resistance Training   Training Prescription Yes    Weight 7 lb    Reps 10-15             Perform Capillary Blood Glucose checks as needed.  Exercise Prescription Changes:   Exercise Prescription Changes     Row Name 08/14/22 1100 09/07/22 1400           Response  to Exercise   Blood Pressure (Admit) 122/62 142/88      Blood Pressure (Exercise) 134/66 142/80      Blood Pressure (Exit) 142/74  recheck 134/70 138/80      Heart Rate (Admit) 70 bpm 68 bpm      Heart Rate (Exercise) 105 bpm 111 bpm      Heart Rate (Exit) 75 bpm 83 bpm      Oxygen Saturation (Admit) 98 % --      Oxygen Saturation (Exercise) 96 % --      Rating of Perceived Exertion (Exercise) 7 11      Symptoms none none      Comments walk test results First full day of Exercise      Duration -- Continue with 30 min of aerobic exercise without signs/symptoms of physical distress.      Intensity -- THRR unchanged        Progression   Progression -- Continue to progress workloads to maintain intensity without signs/symptoms of physical distress.      Average METs -- 5        Resistance Training   Training Prescription -- Yes      Weight -- 5 lb      Reps -- 10-15        Interval Training   Interval Training -- No        Treadmill   MPH -- 2.7      Grade -- 3      Minutes -- 15      METs -- 4.19        REL-XR   Level -- 8      Minutes -- 15      METs -- 5.8        Oxygen   Maintain Oxygen Saturation -- 88% or higher               Exercise Comments:   Exercise Comments     Row Name 09/04/22 0757           Exercise Comments First full day of exercise!  Patient was oriented to gym and equipment including functions, settings, policies, and procedures.  Patient's individual exercise  prescription and treatment plan were reviewed.  All starting workloads were established based on the results of the 6 minute walk test done at initial orientation visit.  The plan for exercise progression was also introduced and progression will be customized based on patient's performance and goals.                Exercise Goals and Review:   Exercise Goals     Row Name 08/14/22 1144             Exercise Goals   Increase Physical Activity Yes       Intervention Provide advice, education, support and counseling about physical activity/exercise needs.;Develop an individualized exercise prescription for aerobic and resistive training based on initial evaluation findings, risk stratification, comorbidities and participant's personal goals.       Expected Outcomes Short Term: Attend rehab on a regular basis to increase amount of physical activity.;Long Term: Exercising regularly at least 3-5 days a week.;Long Term: Add in home exercise to make exercise part of routine and to increase amount of physical activity.       Increase Strength and Stamina Yes       Intervention Provide advice, education, support and counseling about physical activity/exercise needs.;Develop an individualized exercise prescription for aerobic and resistive  training based on initial evaluation findings, risk stratification, comorbidities and participant's personal goals.       Expected Outcomes Short Term: Increase workloads from initial exercise prescription for resistance, speed, and METs.;Short Term: Perform resistance training exercises routinely during rehab and add in resistance training at home;Long Term: Improve cardiorespiratory fitness, muscular endurance and strength as measured by increased METs and functional capacity ( )       Able to understand and use rate of perceived exertion (RPE) scale Yes       Intervention Provide education and explanation on how to use RPE scale       Expected Outcomes Short  Term: Able to use RPE daily in rehab to express subjective intensity level;Long Term:  Able to use RPE to guide intensity level when exercising independently       Able to understand and use Dyspnea scale Yes       Intervention Provide education and explanation on how to use Dyspnea scale       Expected Outcomes Short Term: Able to use Dyspnea scale daily in rehab to express subjective sense of shortness of breath during exertion;Long Term: Able to use Dyspnea scale to guide intensity level when exercising independently       Knowledge and understanding of Target Heart Rate Range (THRR) Yes       Intervention Provide education and explanation of THRR including how the numbers were predicted and where they are located for reference       Expected Outcomes Short Term: Able to state/look up THRR;Short Term: Able to use daily as guideline for intensity in rehab;Long Term: Able to use THRR to govern intensity when exercising independently       Able to check pulse independently Yes       Intervention Provide education and demonstration on how to check pulse in carotid and radial arteries.;Review the importance of being able to check your own pulse for safety during independent exercise       Expected Outcomes Long Term: Able to check pulse independently and accurately;Short Term: Able to explain why pulse checking is important during independent exercise       Understanding of Exercise Prescription Yes       Intervention Provide education, explanation, and written materials on patient's individual exercise prescription       Expected Outcomes Short Term: Able to explain program exercise prescription;Long Term: Able to explain home exercise prescription to exercise independently                Exercise Goals Re-Evaluation :  Exercise Goals Re-Evaluation     Row Name 09/04/22 0757 09/07/22 1426           Exercise Goal Re-Evaluation   Exercise Goals Review Able to understand and use rate of  perceived exertion (RPE) scale;Able to understand and use Dyspnea scale;Knowledge and understanding of Target Heart Rate Range (THRR);Understanding of Exercise Prescription Understanding of Exercise Prescription;Increase Physical Activity;Increase Strength and Stamina      Comments Reviewed RPE  and dyspnea scale, THR and program prescription with pt today.  Pt voiced understanding and was given a copy of goals to take home. Patton Salles is off to a good start in the program. On his first day of exercise he was able to work at level 8 on the XR and walked the treadmill at a speed of 2.7 mph and an incline of 3%. He also did well with 5 lb hand weights for resistance training. We will continue to  monitor his progress in the program.      Expected Outcomes Short: Use RPE daily to regulate intensity. Long: Follow program prescription in THR. Short: Continue to follow initial exercise prescription. Long: Conitnue to improve strength and stamina.               Discharge Exercise Prescription (Final Exercise Prescription Changes):  Exercise Prescription Changes - 09/07/22 1400       Response to Exercise   Blood Pressure (Admit) 142/88    Blood Pressure (Exercise) 142/80    Blood Pressure (Exit) 138/80    Heart Rate (Admit) 68 bpm    Heart Rate (Exercise) 111 bpm    Heart Rate (Exit) 83 bpm    Rating of Perceived Exertion (Exercise) 11    Symptoms none    Comments First full day of Exercise    Duration Continue with 30 min of aerobic exercise without signs/symptoms of physical distress.    Intensity THRR unchanged      Progression   Progression Continue to progress workloads to maintain intensity without signs/symptoms of physical distress.    Average METs 5      Resistance Training   Training Prescription Yes    Weight 5 lb    Reps 10-15      Interval Training   Interval Training No      Treadmill   MPH 2.7    Grade 3    Minutes 15    METs 4.19      REL-XR   Level 8    Minutes 15     METs 5.8      Oxygen   Maintain Oxygen Saturation 88% or higher             Nutrition:  Target Goals: Understanding of nutrition guidelines, daily intake of sodium 1500mg , cholesterol 200mg , calories 30% from fat and 7% or less from saturated fats, daily to have 5 or more servings of fruits and vegetables.  Education: All About Nutrition: -Group instruction provided by verbal, written material, interactive activities, discussions, models, and posters to present general guidelines for heart healthy nutrition including fat, fiber, MyPlate, the role of sodium in heart healthy nutrition, utilization of the nutrition label, and utilization of this knowledge for meal planning. Follow up email sent as well. Written material given at graduation. Flowsheet Row Cardiac Rehab from 09/06/2022 in [facility] Cardiac and Pulmonary Rehab  Education need identified 08/14/22       Biometrics:  Pre Biometrics - 08/14/22 1144       Pre Biometrics   Height 5' 5.5" (1.664 m)    Weight 236 lb 6.4 oz (107.2 kg)    Waist Circumference 45 inches    Hip Circumference 42 inches    Waist to Hip Ratio 1.07 %    BMI (Calculated) 38.73    Single Leg Stand 30 seconds              Nutrition Therapy Plan and Nutrition Goals:  Nutrition Therapy & Goals - 08/14/22 1145       Intervention Plan   Intervention Prescribe, educate and counsel regarding individualized specific dietary modifications aiming towards targeted core components such as weight, hypertension, lipid management, diabetes, heart failure and other comorbidities.    Expected Outcomes Short Term Goal: Understand basic principles of dietary content, such as calories, fat, sodium, cholesterol and nutrients.;Short Term Goal: A plan has been developed with personal nutrition goals set during dietitian appointment.;Long Term Goal: Adherence to prescribed nutrition plan.  Nutrition Assessments:  MEDIFICTS Score Key: ?70 Need to  make dietary changes  40-70 Heart Healthy Diet ? 40 Therapeutic Level Cholesterol Diet  Flowsheet Row Cardiac Rehab from 08/14/2022 in [facility] Cardiac and Pulmonary Rehab  Picture Your Plate Total Score on Admission 57      Picture Your Plate Scores: <78 Unhealthy dietary pattern with much room for improvement. 41-50 Dietary pattern unlikely to meet recommendations for good health and room for improvement. 51-60 More healthful dietary pattern, with some room for improvement.  >60 Healthy dietary pattern, although there may be some specific behaviors that could be improved.    Nutrition Goals Re-Evaluation:   Nutrition Goals Discharge (Final Nutrition Goals Re-Evaluation):   Psychosocial: Target Goals: Acknowledge presence or absence of significant depression and/or stress, maximize coping skills, provide positive support system. Participant is able to verbalize types and ability to use techniques and skills needed for reducing stress and depression.   Education: Stress, Anxiety, and Depression - Group verbal and visual presentation to define topics covered.  Reviews how body is impacted by stress, anxiety, and depression.  Also discusses healthy ways to reduce stress and to treat/manage anxiety and depression.  Written material given at graduation.   Education: Sleep Hygiene -Provides group verbal and written instruction about how sleep can affect your health.  Define sleep hygiene, discuss sleep cycles and impact of sleep habits. Review good sleep hygiene tips.    Initial Review & Psychosocial Screening:  Initial Psych Review & Screening - 08/03/22 1550       Initial Review   Current issues with None Identified      Family Dynamics   Good Support System? Yes   Wife            Quality of Life Scores:   Quality of Life - 08/14/22 1145       Quality of Life   Select Quality of Life      Quality of Life Scores   Health/Function Pre 21.02 %    Socioeconomic Pre 23.29  %    Psych/Spiritual Pre 17.79 %    Family Pre 25.2 %    GLOBAL Pre 21.51 %            Scores of 19 and below usually indicate a poorer quality of life in these areas.  A difference of  2-3 points is a clinically meaningful difference.  A difference of 2-3 points in the total score of the Quality of Life Index has been associated with significant improvement in overall quality of life, self-image, physical symptoms, and general health in studies assessing change in quality of life.  PHQ-9: Review Flowsheet       08/14/2022  Depression screen PHQ 2/9  Decreased Interest 0  Down, Depressed, Hopeless 1  PHQ - 2 Score 1  Altered sleeping 0  Tired, decreased energy 0  Change in appetite 0  Feeling bad or failure about yourself  1  Trouble concentrating 0  Moving slowly or fidgety/restless 0  Suicidal thoughts 0  PHQ-9 Score 2  Difficult doing work/chores Not difficult at all   Interpretation of Total Score  Total Score Depression Severity:  1-4 = Minimal depression, 5-9 = Mild depression, 10-14 = Moderate depression, 15-19 = Moderately severe depression, 20-27 = Severe depression   Psychosocial Evaluation and Intervention:  Psychosocial Evaluation - 08/03/22 1550       Psychosocial Evaluation & Interventions   Comments Osten "Rocco" is coming to cardiac rehab post NSTEMI w/ stent.  He reports no concerns with stress and he is back to work. He reports a good support system including his wife. He is feeling back to baseline and has no barriers to attending the program.    Expected Outcomes Short: Attend cardiac rehab for education and exercise. Long: Develop and maintain positive self care habits.    Continue Psychosocial Services  Follow up required by staff             Psychosocial Re-Evaluation:   Psychosocial Discharge (Final Psychosocial Re-Evaluation):   Vocational Rehabilitation: Provide vocational rehab assistance to qualifying candidates.   Vocational Rehab  Evaluation & Intervention:  Vocational Rehab - 08/03/22 1550       Initial Vocational Rehab Evaluation & Intervention   Assessment shows need for Vocational Rehabilitation No             Education: Education Goals: Education classes will be provided on a variety of topics geared toward better understanding of heart health and risk factor modification. Participant will state understanding/return demonstration of topics presented as noted by education test scores.  Learning Barriers/Preferences:  Learning Barriers/Preferences - 08/03/22 1550       Learning Barriers/Preferences   Learning Barriers None    Learning Preferences None             General Cardiac Education Topics:  AED/CPR: - Group verbal and written instruction with the use of models to demonstrate the basic use of the AED with the basic ABC's of resuscitation.   Anatomy and Cardiac Procedures: - Group verbal and visual presentation and models provide information about basic cardiac anatomy and function. Reviews the testing methods done to diagnose heart disease and the outcomes of the test results. Describes the treatment choices: Medical Management, Angioplasty, or Coronary Bypass Surgery for treating various heart conditions including Myocardial Infarction, Angina, Valve Disease, and Cardiac Arrhythmias.  Written material given at graduation. Flowsheet Row Cardiac Rehab from 09/06/2022 in [facility] Cardiac and Pulmonary Rehab  Education need identified 08/14/22       Medication Safety: - Group verbal and visual instruction to review commonly prescribed medications for heart and lung disease. Reviews the medication, class of the drug, and side effects. Includes the steps to properly store meds and maintain the prescription regimen.  Written material given at graduation.   Intimacy: - Group verbal instruction through game format to discuss how heart and lung disease can affect sexual intimacy. Written material  given at graduation..   Know Your Numbers and Heart Failure: - Group verbal and visual instruction to discuss disease risk factors for cardiac and pulmonary disease and treatment options.  Reviews associated critical values for Overweight/Obesity, Hypertension, Cholesterol, and Diabetes.  Discusses basics of heart failure: signs/symptoms and treatments.  Introduces Heart Failure Zone chart for action plan for heart failure.  Written material given at graduation.   Infection Prevention: - Provides verbal and written material to individual with discussion of infection control including proper hand washing and proper equipment cleaning during exercise session. Flowsheet Row Cardiac Rehab from 09/06/2022 in [facility] Cardiac and Pulmonary Rehab  Date 08/14/22  Educator [facility]  Instruction Review Code 1- Verbalizes Understanding       Falls Prevention: - Provides verbal and written material to individual with discussion of falls prevention and safety. Flowsheet Row Cardiac Rehab from 09/06/2022 in [facility] Cardiac and Pulmonary Rehab  Date 08/14/22  Educator [facility]  Instruction Review Code 1- Verbalizes Understanding       Other: -Provides group and verbal instruction on  various topics (see comments) Flowsheet Row Cardiac Rehab from 09/06/2022 in [facility] Cardiac and Pulmonary Rehab  Date 09/06/22  Educator [employer]  Instruction Review Code 1- Verbalizes Understanding       Knowledge Questionnaire Score:  Knowledge Questionnaire Score - 08/14/22 1146       Knowledge Questionnaire Score   Pre Score 20/26             Core Components/Risk Factors/Patient Goals at Admission:  Personal Goals and Risk Factors at Admission - 08/14/22 1146       Core Components/Risk Factors/Patient Goals on Admission    Weight Management Yes;Obesity;Weight Loss    Intervention Weight Management: Develop a combined nutrition and exercise program designed to reach desired caloric intake, while maintaining  appropriate intake of nutrient and fiber, sodium and fats, and appropriate energy expenditure required for the weight goal.;Weight Management: Provide education and appropriate resources to help participant work on and attain dietary goals.;Weight Management/Obesity: Establish reasonable short term and long term weight goals.;Obesity: Provide education and appropriate resources to help participant work on and attain dietary goals.    Admit Weight 236 lb 6.4 oz (107.2 kg)    Goal Weight: Short Term 230 lb (104.3 kg)    Goal Weight: Long Term 225 lb (102.1 kg)    Expected Outcomes Short Term: Continue to assess and modify interventions until short term weight is achieved;Long Term: Adherence to nutrition and physical activity/exercise program aimed toward attainment of established weight goal;Weight Loss: Understanding of general recommendations for a balanced deficit meal plan, which promotes 1-2 lb weight loss per week and includes a negative energy balance of [phone] kcal/d;Understanding recommendations for meals to include 15-35% energy as protein, 25-35% energy from fat, 35-60% energy from carbohydrates, less than 200mg  of dietary cholesterol, 20-35 gm of total fiber daily;Understanding of distribution of calorie intake throughout the day with the consumption of 4-5 meals/snacks    Hypertension Yes    Intervention Provide education on lifestyle modifcations including regular physical activity/exercise, weight management, moderate sodium restriction and increased consumption of fresh fruit, vegetables, and low fat dairy, alcohol moderation, and smoking cessation.;Monitor prescription use compliance.    Expected Outcomes Short Term: Continued assessment and intervention until BP is < 140/74mm HG in hypertensive participants. < 130/30mm HG in hypertensive participants with diabetes, heart failure or chronic kidney disease.;Long Term: Maintenance of blood pressure at goal levels.    Lipids Yes     Intervention Provide education and support for participant on nutrition & aerobic/resistive exercise along with prescribed medications to achieve LDL 70mg , HDL >40mg .    Expected Outcomes Short Term: Participant states understanding of desired cholesterol values and is compliant with medications prescribed. Participant is following exercise prescription and nutrition guidelines.;Long Term: Cholesterol controlled with medications as prescribed, with individualized exercise RX and with personalized nutrition plan. Value goals: LDL < 70mg , HDL > 40 mg.             Education:Diabetes - Individual verbal and written instruction to review signs/symptoms of diabetes, desired ranges of glucose level fasting, after meals and with exercise. Acknowledge that pre and post exercise glucose checks will be done for 3 sessions at entry of program.   Core Components/Risk Factors/Patient Goals Review:    Core Components/Risk Factors/Patient Goals at Discharge (Final Review):    ITP Comments:  ITP Comments     Row Name 08/03/22 1547 08/14/22 1141 08/23/22 1126 09/04/22 0757 09/19/22 1500   ITP Comments Initial phone call completed. Dx can be found  in [facility] 5/14. EP orientation scheduled for 6/3 at 10:00 am. Completed and gym orientation. Initial ITP created and sent for review to Dr. Bethann Punches, Medical Director.  Pt wanted to talk to doctor about rehab prior to scheduling class/exercise time.  He will call to let us know. 30 Day review completed. Medical Director ITP review done, changes made as directed, and signed approval by Medical Director.    new to program First full day of exercise!  Patient was oriented to gym and equipment including functions, settings, policies, and procedures.  Patient's individual exercise prescription and treatment plan were reviewed.  All starting workloads were established based on the results of the 6 minute walk test done at initial orientation visit.  The plan for  exercise progression was also introduced and progression will be customized based on patient's performance and goals. 30 Day review completed. Medical Director ITP review done, changes made as directed, and signed approval by Medical Director.   new to program            Comments:

## 2022-09-20 ENCOUNTER — Other Ambulatory Visit: Payer: Self-pay

## 2022-09-20 ENCOUNTER — Encounter: Payer: 59 | Admitting: *Deleted

## 2022-09-20 DIAGNOSIS — Z955 Presence of coronary angioplasty implant and graft: Secondary | ICD-10-CM

## 2022-09-20 DIAGNOSIS — I214 Non-ST elevation (NSTEMI) myocardial infarction: Secondary | ICD-10-CM

## 2022-09-20 NOTE — Progress Notes (Signed)
Daily Session Note  Patient Details  Name: Kevin Clark MRN: [id number] Date of Birth: 01-12-1981 Referring Provider:   Flowsheet Row Cardiac Rehab from 08/14/2022 in [facility] Cardiac and Pulmonary Rehab  Referring Provider End, Cristal Deer MD       Encounter Date: 09/20/2022  Check In:  Session Check In - 09/20/22 0851       Check-In   Supervising physician immediately available to respond to emergencies See telemetry face sheet for immediately available ER MD    Location [facility]-Cardiac & Pulmonary Rehab    Staff Present Elige Ko, Guinevere Ferrari, RN, ADN;Kristen Coble, RN,BC,MSN;Krista Karleen Hampshire RN, BSN    Virtual Visit No    Medication changes reported     No    Fall or balance concerns reported    No    Warm-up and Cool-down Performed on first and last piece of equipment    Resistance Training Performed Yes    VAD Patient? No    PAD/SET Patient? No      Pain Assessment   Currently in Pain? No/denies                Social History   Tobacco Use  Smoking Status Some Days   Types: Cigars  Smokeless Tobacco Never    Goals Met:  Independence with exercise equipment Exercise tolerated well No report of concerns or symptoms today Strength training completed today  Goals Unmet:  Not Applicable  Comments: Pt able to follow exercise prescription today without complaint.  Will continue to monitor for progression.    Dr. Bethann Punches is Medical Director for [facility] Cardiac Rehabilitation.  Dr. Vida Rigger is Medical Director for [facility] Pulmonary Rehabilitation.

## 2022-09-22 ENCOUNTER — Encounter: Payer: 59 | Admitting: *Deleted

## 2022-09-22 DIAGNOSIS — Z955 Presence of coronary angioplasty implant and graft: Secondary | ICD-10-CM | POA: Diagnosis not present

## 2022-09-22 DIAGNOSIS — I214 Non-ST elevation (NSTEMI) myocardial infarction: Secondary | ICD-10-CM

## 2022-09-22 NOTE — Progress Notes (Signed)
Daily Session Note  Patient Details  Name: Kevin Clark MRN: [id number] Date of Birth: Nov 19, 1980 Referring Provider:   Flowsheet Row Cardiac Rehab from 08/14/2022 in [facility] Cardiac and Pulmonary Rehab  Referring Provider End, Cristal Deer MD       Encounter Date: 09/22/2022  Check In:  Session Check In - 09/22/22 0816       Check-In   Supervising physician immediately available to respond to emergencies See telemetry face sheet for immediately available ER MD    Location [facility]-Cardiac & Pulmonary Rehab    Staff Present Cora Collum, RN, BSN, CCRP;Kristen Coble, RN,BC,MSN;Joseph Becker, [place]    Virtual Visit No    Medication changes reported     No    Fall or balance concerns reported    No    Warm-up and Cool-down Performed on first and last piece of equipment    Resistance Training Performed Yes    VAD Patient? No    PAD/SET Patient? No      Pain Assessment   Currently in Pain? No/denies                Social History   Tobacco Use  Smoking Status Some Days   Types: Cigars  Smokeless Tobacco Never    Goals Met:  Independence with exercise equipment Exercise tolerated well No report of concerns or symptoms today  Goals Unmet:  Not Applicable  Comments: Pt able to follow exercise prescription today without complaint.  Will continue to monitor for progression.    Dr. Bethann Punches is Medical Director for [facility] Cardiac Rehabilitation.  Dr. Vida Rigger is Medical Director for [facility] Pulmonary Rehabilitation.

## 2022-09-25 ENCOUNTER — Encounter: Payer: 59 | Admitting: *Deleted

## 2022-09-25 DIAGNOSIS — I214 Non-ST elevation (NSTEMI) myocardial infarction: Secondary | ICD-10-CM

## 2022-09-25 DIAGNOSIS — Z955 Presence of coronary angioplasty implant and graft: Secondary | ICD-10-CM

## 2022-09-25 NOTE — Progress Notes (Signed)
Daily Session Note  Patient Details  Name: Kevin Clark MRN: [id number] Date of Birth: [birth year] Referring Provider:   Flowsheet Row Cardiac Rehab from 08/14/2022 in [facility] Cardiac and Pulmonary Rehab  Referring Provider End, Cristal Deer MD       Encounter Date: 09/25/2022  Check In:  Session Check In - 09/25/22 0742       Check-In   Supervising physician immediately available to respond to emergencies See telemetry face sheet for immediately available ER MD    Location [facility]-Cardiac & Pulmonary Rehab    Staff Present Lanny Hurst, RN, ADN;Joseph Reino Kent, RCP,RRT,BSRT;Kelly Madilyn Fireman, BS, ACSM CEP, Exercise Physiologist    Virtual Visit No    Medication changes reported     No    Fall or balance concerns reported    No    Warm-up and Cool-down Performed on first and last piece of equipment    Resistance Training Performed Yes    VAD Patient? No    PAD/SET Patient? No      Pain Assessment   Currently in Pain? No/denies                Social History   Tobacco Use  Smoking Status Some Days   Types: Cigars  Smokeless Tobacco Never    Goals Met:  Independence with exercise equipment Exercise tolerated well No report of concerns or symptoms today Strength training completed today  Goals Unmet:  Not Applicable  Comments: Pt able to follow exercise prescription today without complaint.  Will continue to monitor for progression.    Dr. Bethann Punches is Medical Director for [facility] Cardiac Rehabilitation.  Dr. Vida Rigger is Medical Director for [facility] Pulmonary Rehabilitation.

## 2022-09-27 ENCOUNTER — Encounter: Payer: 59 | Admitting: *Deleted

## 2022-09-27 DIAGNOSIS — Z955 Presence of coronary angioplasty implant and graft: Secondary | ICD-10-CM

## 2022-09-27 DIAGNOSIS — I214 Non-ST elevation (NSTEMI) myocardial infarction: Secondary | ICD-10-CM | POA: Diagnosis not present

## 2022-09-27 NOTE — Progress Notes (Signed)
Daily Session Note  Patient Details  Name: Kevin Clark MRN: [id number] Date of Birth: [birth year] Referring Provider:   Flowsheet Row Cardiac Rehab from 08/14/2022 in [facility] Cardiac and Pulmonary Rehab  Referring Provider End, Cristal Deer MD       Encounter Date: 09/27/2022  Check In:  Session Check In - 09/27/22 0800       Check-In   Supervising physician immediately available to respond to emergencies See telemetry face sheet for immediately available ER MD    Location [facility]-Cardiac & Pulmonary Rehab    Staff Present Lanny Hurst, RN, ADN;Joseph [place], RCP,RRT,BSRT;Other   Girtha Rm, MS   Virtual Visit No    Medication changes reported     No    Fall or balance concerns reported    No    Warm-up and Cool-down Performed on first and last piece of equipment    Resistance Training Performed Yes    VAD Patient? No    PAD/SET Patient? No      Pain Assessment   Currently in Pain? No/denies                Social History   Tobacco Use  Smoking Status Some Days   Types: Cigars  Smokeless Tobacco Never    Goals Met:  Independence with exercise equipment Exercise tolerated well No report of concerns or symptoms today Strength training completed today  Goals Unmet:  Not Applicable  Comments: Pt able to follow exercise prescription today without complaint.  Will continue to monitor for progression.    Dr. Bethann Punches is Medical Director for [facility] Cardiac Rehabilitation.  Dr. Vida Rigger is Medical Director for [facility] Pulmonary Rehabilitation.

## 2022-10-02 ENCOUNTER — Encounter: Payer: 59 | Admitting: *Deleted

## 2022-10-04 ENCOUNTER — Encounter: Payer: 59 | Admitting: *Deleted

## 2022-10-04 VITALS — Ht 65.5 in | Wt 239.0 lb

## 2022-10-04 DIAGNOSIS — Z955 Presence of coronary angioplasty implant and graft: Secondary | ICD-10-CM | POA: Diagnosis not present

## 2022-10-04 DIAGNOSIS — I214 Non-ST elevation (NSTEMI) myocardial infarction: Secondary | ICD-10-CM | POA: Diagnosis not present

## 2022-10-04 NOTE — Progress Notes (Signed)
Cardiac Individual Treatment Plan  Patient Details  Name: Kevin Clark MRN: [id number] Date of Birth: 1980-10-23 Referring Provider:   Flowsheet Row Cardiac Rehab from 08/14/2022 in [facility] Cardiac and Pulmonary Rehab  Referring Provider End, Cristal Deer MD       Initial Encounter Date:  Flowsheet Row Cardiac Rehab from 08/14/2022 in [facility] Cardiac and Pulmonary Rehab  Date 08/14/22       Visit Diagnosis: NSTEMI (non-ST elevation myocardial infarction) [facility])  Status post coronary artery stent placement  Patient's Home Medications on Admission:  Current Outpatient Medications:    aspirin EC 81 MG tablet, Take 1 tablet (81 mg total) by mouth daily. Swallow whole., Disp: 30 tablet, Rfl: 12   atorvastatin (LIPITOR) 40 MG tablet, Take 1 tablet (40 mg total) by mouth daily., Disp: 90 tablet, Rfl: 3   atorvastatin (LIPITOR) 80 MG tablet, Take 1 tablet (80 mg total) by mouth daily., Disp: 30 tablet, Rfl: 3   Evolocumab (REPATHA SURECLICK) 140 MG/ML SOAJ, Inject 140 mg into the skin every 14 (fourteen) days., Disp: 2 mL, Rfl: 2   metoprolol succinate (TOPROL-XL) 25 MG 24 hr tablet, Take 1 tablet (25 mg total) by mouth daily., Disp: 90 tablet, Rfl: 3   nitroGLYCERIN (NITROSTAT) 0.4 MG SL tablet, Place 1 tablet (0.4 mg total) under the tongue every 5 (five) minutes as needed for chest pain., Disp: 25 tablet, Rfl: 2   ticagrelor (BRILINTA) 90 MG TABS tablet, Take 1 tablet (90 mg total) by mouth 2 (two) times daily., Disp: 180 tablet, Rfl: 3  Past Medical History: Past Medical History:  Diagnosis Date   Hyperlipidemia    Obesity    Palpitations     Tobacco Use: Social History   Tobacco Use  Smoking Status Some Days   Types: Cigars  Smokeless Tobacco Never    Labs: Review Flowsheet       Latest Ref Rng & Units 07/26/2022  Labs for ITP Cardiac and Pulmonary Rehab  Cholestrol 0 - 200 mg/dL 284   LDL (calc) 0 - 99 mg/dL 132   HDL-C >44 mg/dL 47   Trlycerides <010 mg/dL 272      Details             Exercise Target Goals: Exercise Program Goal: Individual exercise prescription set using results from initial 6 min walk test and THRR while considering  patient's activity barriers and safety.   Exercise Prescription Goal: Initial exercise prescription builds to 30-45 minutes a day of aerobic activity, 2-3 days per week.  Home exercise guidelines will be given to patient during program as part of exercise prescription that the participant will acknowledge.   Education: Aerobic Exercise: - Group verbal and visual presentation on the components of exercise prescription. Introduces F.I.T.T principle from ACSM for exercise prescriptions.  Reviews F.I.T.T. principles of aerobic exercise including progression. Written material given at graduation. Flowsheet Row Cardiac Rehab from 09/06/2022 in [facility] Cardiac and Pulmonary Rehab  Education need identified 08/14/22       Education: Resistance Exercise: - Group verbal and visual presentation on the components of exercise prescription. Introduces F.I.T.T principle from ACSM for exercise prescriptions  Reviews F.I.T.T. principles of resistance exercise including progression. Written material given at graduation.    Education: Exercise & Equipment Safety: - Individual verbal instruction and demonstration of equipment use and safety with use of the equipment. Flowsheet Row Cardiac Rehab from 09/06/2022 in [facility] Cardiac and Pulmonary Rehab  Date 08/14/22  Educator [facility]  Instruction Review Code 1- [employer]  Understanding       Education: Exercise Physiology & General Exercise Guidelines: - Group verbal and written instruction with models to review the exercise physiology of the cardiovascular system and associated critical values. Provides general exercise guidelines with specific guidelines to those with heart or lung disease.    Education: Flexibility, Balance, Mind/Body Relaxation: - Group verbal and visual presentation  with interactive activity on the components of exercise prescription. Introduces F.I.T.T principle from ACSM for exercise prescriptions. Reviews F.I.T.T. principles of flexibility and balance exercise training including progression. Also discusses the mind body connection.  Reviews various relaxation techniques to help reduce and manage stress (i.e. Deep breathing, progressive muscle relaxation, and visualization). Balance handout provided to take home. Written material given at graduation.   Activity Barriers & Risk Stratification:  Activity Barriers & Cardiac Risk Stratification - 08/14/22 1142       Activity Barriers & Cardiac Risk Stratification   Activity Barriers None    Cardiac Risk Stratification Moderate             6 Minute Walk:  6 Minute Walk     Row Name 08/14/22 1141 10/04/22 0937       6 Minute Walk   Phase Initial Discharge    Distance 1400 feet 1400 feet    Distance % Change -- 0 %    Distance Feet Change -- 0 ft    Walk Time 6 minutes 6 minutes    # of Rest Breaks 0 0    MPH 2.65 2.65    METS 3.99 4.01    RPE 7 7    VO2 Peak 13.98 14.05    Symptoms No No    Resting HR 70 bpm 63 bpm    Resting BP 122/62 110/78    Resting Oxygen Saturation  98 % 96 %    Exercise Oxygen Saturation  during 6 min walk 96 % 96 %    Max Ex. HR 105 bpm 93 bpm    Max Ex. BP 134/66 162/80    2 Minute Post BP 134/70 124/80             6 Minute Walk     Row Name 08/14/22 1141 10/04/22 0937       6 Minute Walk   Phase Initial Discharge    Distance 1400 feet 1400 feet    Distance % Change -- 0 %    Distance Feet Change -- 0 ft    Walk Time 6 minutes 6 minutes    # of Rest Breaks 0 0    MPH 2.65 2.65    METS 3.99 4.01    RPE 7 7    VO2 Peak 13.98 14.05    Symptoms No No    Resting HR 70 bpm 63 bpm    Resting BP 122/62 110/78    Resting Oxygen Saturation  98 % 96 %    Exercise Oxygen Saturation  during 6 min walk 96 % 96 %    Max Ex. HR 105 bpm 93 bpm    Max Ex.  BP 134/66 162/80    2 Minute Post BP 134/70 124/80             Oxygen Initial Assessment:   Oxygen Re-Evaluation:   Oxygen Discharge (Final Oxygen Re-Evaluation):   Initial Exercise Prescription:  Initial Exercise Prescription - 08/14/22 1100       Date of Initial Exercise RX and Referring Provider   Date 08/14/22    Referring  Provider End, Christopher MD      Oxygen   Maintain Oxygen Saturation 88% or higher      Treadmill   MPH 2.7    Grade 2    Minutes 15    METs 3.81      Elliptical   Level 1    Speed 3.9    Minutes 15    METs 3.8      Prescription Details   Frequency (times per week) 3    Duration Progress to 30 minutes of continuous aerobic without signs/symptoms of physical distress      Intensity   THRR 40-80% of Max Heartrate 113-156    Ratings of Perceived Exertion 11-13    Perceived Dyspnea 0-4      Progression   Progression Continue to progress workloads to maintain intensity without signs/symptoms of physical distress.      Resistance Training   Training Prescription Yes    Weight 7 lb    Reps 10-15             Perform Capillary Blood Glucose checks as needed.  Exercise Prescription Changes:   Exercise Prescription Changes     Row Name 08/14/22 1100 09/07/22 1400 09/20/22 1600         Response to Exercise   Blood Pressure (Admit) 122/62 142/88 140/84     Blood Pressure (Exercise) 134/66 142/80 158/82     Blood Pressure (Exit) 142/74  recheck 134/70 138/80 142/90     Heart Rate (Admit) 70 bpm 68 bpm 72 bpm     Heart Rate (Exercise) 105 bpm 111 bpm 130 bpm     Heart Rate (Exit) 75 bpm 83 bpm 86 bpm     Oxygen Saturation (Admit) 98 % -- --     Oxygen Saturation (Exercise) 96 % -- --     Rating of Perceived Exertion (Exercise) 7 11 13      Symptoms none none none     Comments walk test results First full day of Exercise --     Duration -- Continue with 30 min of aerobic exercise without signs/symptoms of physical distress.  Continue with 30 min of aerobic exercise without signs/symptoms of physical distress.     Intensity -- THRR unchanged THRR unchanged       Progression   Progression -- Continue to progress workloads to maintain intensity without signs/symptoms of physical distress. Continue to progress workloads to maintain intensity without signs/symptoms of physical distress.     Average METs -- 5 4.83       Resistance Training   Training Prescription -- Yes Yes     Weight -- 5 lb 8     Reps -- 10-15 10-15       Interval Training   Interval Training -- No No       Treadmill   MPH -- 2.7 2.8     Grade -- 3 3     Minutes -- 15 15     METs -- 4.19 4.3       Elliptical   Level -- -- 8     Speed -- -- 3.9     Minutes -- -- 15     METs -- -- 6.6       REL-XR   Level -- 8 9     Minutes -- 15 15     METs -- 5.8 5.8       Oxygen   Maintain Oxygen Saturation -- 88% or higher 88% or  higher              Exercise Prescription Changes     Row Name 08/14/22 1100 09/07/22 1400 09/20/22 1600         Response to Exercise   Blood Pressure (Admit) 122/62 142/88 140/84     Blood Pressure (Exercise) 134/66 142/80 158/82     Blood Pressure (Exit) 142/74  recheck 134/70 138/80 142/90     Heart Rate (Admit) 70 bpm 68 bpm 72 bpm     Heart Rate (Exercise) 105 bpm 111 bpm 130 bpm     Heart Rate (Exit) 75 bpm 83 bpm 86 bpm     Oxygen Saturation (Admit) 98 % -- --     Oxygen Saturation (Exercise) 96 % -- --     Rating of Perceived Exertion (Exercise) 7 11 13      Symptoms none none none     Comments walk test results First full day of Exercise --     Duration -- Continue with 30 min of aerobic exercise without signs/symptoms of physical distress. Continue with 30 min of aerobic exercise without signs/symptoms of physical distress.     Intensity -- THRR unchanged THRR unchanged       Progression   Progression -- Continue to progress workloads to maintain intensity without signs/symptoms of physical  distress. Continue to progress workloads to maintain intensity without signs/symptoms of physical distress.     Average METs -- 5 4.83       Resistance Training   Training Prescription -- Yes Yes     Weight -- 5 lb 8     Reps -- 10-15 10-15       Interval Training   Interval Training -- No No       Treadmill   MPH -- 2.7 2.8     Grade -- 3 3     Minutes -- 15 15     METs -- 4.19 4.3       Elliptical   Level -- -- 8     Speed -- -- 3.9     Minutes -- -- 15     METs -- -- 6.6       REL-XR   Level -- 8 9     Minutes -- 15 15     METs -- 5.8 5.8       Oxygen   Maintain Oxygen Saturation -- 88% or higher 88% or higher              Exercise Comments:   Exercise Comments     Row Name 09/04/22 0757           Exercise Comments First full day of exercise!  Patient was oriented to gym and equipment including functions, settings, policies, and procedures.  Patient's individual exercise prescription and treatment plan were reviewed.  All starting workloads were established based on the results of the 6 minute walk test done at initial orientation visit.  The plan for exercise progression was also introduced and progression will be customized based on patient's performance and goals.                Exercise Comments     Row Name 09/04/22 0757           Exercise Comments First full day of exercise!  Patient was oriented to gym and equipment including functions, settings, policies, and procedures.  Patient's individual exercise prescription and treatment plan were reviewed.  All starting workloads were  established based on the results of the 6 minute walk test done at initial orientation visit.  The plan for exercise progression was also introduced and progression will be customized based on patient's performance and goals.                Exercise Goals and Review:   Exercise Goals     Row Name 08/14/22 1144             Exercise Goals   Increase Physical  Activity Yes       Intervention Provide advice, education, support and counseling about physical activity/exercise needs.;Develop an individualized exercise prescription for aerobic and resistive training based on initial evaluation findings, risk stratification, comorbidities and participant's personal goals.       Expected Outcomes Short Term: Attend rehab on a regular basis to increase amount of physical activity.;Long Term: Exercising regularly at least 3-5 days a week.;Long Term: Add in home exercise to make exercise part of routine and to increase amount of physical activity.       Increase Strength and Stamina Yes       Intervention Provide advice, education, support and counseling about physical activity/exercise needs.;Develop an individualized exercise prescription for aerobic and resistive training based on initial evaluation findings, risk stratification, comorbidities and participant's personal goals.       Expected Outcomes Short Term: Increase workloads from initial exercise prescription for resistance, speed, and METs.;Short Term: Perform resistance training exercises routinely during rehab and add in resistance training at home;Long Term: Improve cardiorespiratory fitness, muscular endurance and strength as measured by increased METs and functional capacity ( )       Able to understand and use rate of perceived exertion (RPE) scale Yes       Intervention Provide education and explanation on how to use RPE scale       Expected Outcomes Short Term: Able to use RPE daily in rehab to express subjective intensity level;Long Term:  Able to use RPE to guide intensity level when exercising independently       Able to understand and use Dyspnea scale Yes       Intervention Provide education and explanation on how to use Dyspnea scale       Expected Outcomes Short Term: Able to use Dyspnea scale daily in rehab to express subjective sense of shortness of breath during exertion;Long Term: Able to  use Dyspnea scale to guide intensity level when exercising independently       Knowledge and understanding of Target Heart Rate Range (THRR) Yes       Intervention Provide education and explanation of THRR including how the numbers were predicted and where they are located for reference       Expected Outcomes Short Term: Able to state/look up THRR;Short Term: Able to use daily as guideline for intensity in rehab;Long Term: Able to use THRR to govern intensity when exercising independently       Able to check pulse independently Yes       Intervention Provide education and demonstration on how to check pulse in carotid and radial arteries.;Review the importance of being able to check your own pulse for safety during independent exercise       Expected Outcomes Long Term: Able to check pulse independently and accurately;Short Term: Able to explain why pulse checking is important during independent exercise       Understanding of Exercise Prescription Yes       Intervention Provide education, explanation, and written materials  on patient's individual exercise prescription       Expected Outcomes Short Term: Able to explain program exercise prescription;Long Term: Able to explain home exercise prescription to exercise independently                Exercise Goals     Row Name 08/14/22 1144             Exercise Goals   Increase Physical Activity Yes       Intervention Provide advice, education, support and counseling about physical activity/exercise needs.;Develop an individualized exercise prescription for aerobic and resistive training based on initial evaluation findings, risk stratification, comorbidities and participant's personal goals.       Expected Outcomes Short Term: Attend rehab on a regular basis to increase amount of physical activity.;Long Term: Exercising regularly at least 3-5 days a week.;Long Term: Add in home exercise to make exercise part of routine and to increase amount of  physical activity.       Increase Strength and Stamina Yes       Intervention Provide advice, education, support and counseling about physical activity/exercise needs.;Develop an individualized exercise prescription for aerobic and resistive training based on initial evaluation findings, risk stratification, comorbidities and participant's personal goals.       Expected Outcomes Short Term: Increase workloads from initial exercise prescription for resistance, speed, and METs.;Short Term: Perform resistance training exercises routinely during rehab and add in resistance training at home;Long Term: Improve cardiorespiratory fitness, muscular endurance and strength as measured by increased METs and functional capacity ( )       Able to understand and use rate of perceived exertion (RPE) scale Yes       Intervention Provide education and explanation on how to use RPE scale       Expected Outcomes Short Term: Able to use RPE daily in rehab to express subjective intensity level;Long Term:  Able to use RPE to guide intensity level when exercising independently       Able to understand and use Dyspnea scale Yes       Intervention Provide education and explanation on how to use Dyspnea scale       Expected Outcomes Short Term: Able to use Dyspnea scale daily in rehab to express subjective sense of shortness of breath during exertion;Long Term: Able to use Dyspnea scale to guide intensity level when exercising independently       Knowledge and understanding of Target Heart Rate Range (THRR) Yes       Intervention Provide education and explanation of THRR including how the numbers were predicted and where they are located for reference       Expected Outcomes Short Term: Able to state/look up THRR;Short Term: Able to use daily as guideline for intensity in rehab;Long Term: Able to use THRR to govern intensity when exercising independently       Able to check pulse independently Yes       Intervention Provide  education and demonstration on how to check pulse in carotid and radial arteries.;Review the importance of being able to check your own pulse for safety during independent exercise       Expected Outcomes Long Term: Able to check pulse independently and accurately;Short Term: Able to explain why pulse checking is important during independent exercise       Understanding of Exercise Prescription Yes       Intervention Provide education, explanation, and written materials on patient's individual exercise prescription  Expected Outcomes Short Term: Able to explain program exercise prescription;Long Term: Able to explain home exercise prescription to exercise independently                Exercise Goals Re-Evaluation :  Exercise Goals Re-Evaluation     Row Name 09/04/22 0757 09/07/22 1426 09/20/22 1626         Exercise Goal Re-Evaluation   Exercise Goals Review Able to understand and use rate of perceived exertion (RPE) scale;Able to understand and use Dyspnea scale;Knowledge and understanding of Target Heart Rate Range (THRR);Understanding of Exercise Prescription Understanding of Exercise Prescription;Increase Physical Activity;Increase Strength and Stamina Increase Physical Activity;Increase Strength and Stamina;Understanding of Exercise Prescription     Comments Reviewed RPE  and dyspnea scale, THR and program prescription with pt today.  Pt voiced understanding and was given a copy of goals to take home. Patton Salles is off to a good start in the program. On his first day of exercise he was able to work at level 8 on the XR and walked the treadmill at a speed of 2.7 mph and an incline of 3%. He also did well with 5 lb hand weights for resistance training. We will continue to monitor his progress in the program. Rocco conitinues to progress with his workloads all were increased in the past 2 weeks. TM 2.7/3% to 2.8/3%, ellipticallevel 1 to level 8 and REXR level 8 to level 9. He has also incresed  heandweights to 8lbs.  Continue to monitor exercise progression which will lead to goal of increased stamina and strength     Expected Outcomes Short: Use RPE daily to regulate intensity. Long: Follow program prescription in THR. Short: Continue to follow initial exercise prescription. Long: Conitnue to improve strength and stamina. STG COntinue exercise progression as tolerated, working on increasing sstrength and stamina LTG Continued exercie progression as tolerated after discharge              Exercise Goals Re-Evaluation     Row Name 09/04/22 0757 09/07/22 1426 09/20/22 1626         Exercise Goal Re-Evaluation   Exercise Goals Review Able to understand and use rate of perceived exertion (RPE) scale;Able to understand and use Dyspnea scale;Knowledge and understanding of Target Heart Rate Range (THRR);Understanding of Exercise Prescription Understanding of Exercise Prescription;Increase Physical Activity;Increase Strength and Stamina Increase Physical Activity;Increase Strength and Stamina;Understanding of Exercise Prescription     Comments Reviewed RPE  and dyspnea scale, THR and program prescription with pt today.  Pt voiced understanding and was given a copy of goals to take home. Patton Salles is off to a good start in the program. On his first day of exercise he was able to work at level 8 on the XR and walked the treadmill at a speed of 2.7 mph and an incline of 3%. He also did well with 5 lb hand weights for resistance training. We will continue to monitor his progress in the program. Rocco conitinues to progress with his workloads all were increased in the past 2 weeks. TM 2.7/3% to 2.8/3%, ellipticallevel 1 to level 8 and REXR level 8 to level 9. He has also incresed heandweights to 8lbs.  Continue to monitor exercise progression which will lead to goal of increased stamina and strength     Expected Outcomes Short: Use RPE daily to regulate intensity. Long: Follow program prescription in THR.  Short: Continue to follow initial exercise prescription. Long: Conitnue to improve strength and stamina. STG COntinue exercise progression as  tolerated, working on [employer], television/film set and stamina LTG Continued exercie progression as tolerated after discharge              Discharge Exercise Prescription (Final Exercise Prescription Changes):  Exercise Prescription Changes - 09/20/22 1600       Response to Exercise   Blood Pressure (Admit) 140/84    Blood Pressure (Exercise) 158/82    Blood Pressure (Exit) 142/90    Heart Rate (Admit) 72 bpm    Heart Rate (Exercise) 130 bpm    Heart Rate (Exit) 86 bpm    Rating of Perceived Exertion (Exercise) 13    Symptoms none    Duration Continue with 30 min of aerobic exercise without signs/symptoms of physical distress.    Intensity THRR unchanged      Progression   Progression Continue to progress workloads to maintain intensity without signs/symptoms of physical distress.    Average METs 4.83      Resistance Training   Training Prescription Yes    Weight 8    Reps 10-15      Interval Training   Interval Training No      Treadmill   MPH 2.8    Grade 3    Minutes 15    METs 4.3      Elliptical   Level 8    Speed 3.9    Minutes 15    METs 6.6      REL-XR   Level 9    Minutes 15    METs 5.8      Oxygen   Maintain Oxygen Saturation 88% or higher             Nutrition:  Target Goals: Understanding of nutrition guidelines, daily intake of sodium 1500mg , cholesterol 200mg , calories 30% from fat and 7% or less from saturated fats, daily to have 5 or more servings of fruits and vegetables.  Education: All About Nutrition: -Group instruction provided by verbal, written material, interactive activities, discussions, models, and posters to present general guidelines for heart healthy nutrition including fat, fiber, MyPlate, the role of sodium in heart healthy nutrition, utilization of the nutrition label, and utilization  of this knowledge for meal planning. Follow up email sent as well. Written material given at graduation. Flowsheet Row Cardiac Rehab from 09/06/2022 in [facility] Cardiac and Pulmonary Rehab  Education need identified 08/14/22       Biometrics:  Pre Biometrics - 08/14/22 1144       Pre Biometrics   Height 5' 5.5" (1.664 m)    Weight 236 lb 6.4 oz (107.2 kg)    Waist Circumference 45 inches    Hip Circumference 42 inches    Waist to Hip Ratio 1.07 %    BMI (Calculated) 38.73    Single Leg Stand 30 seconds             Post Biometrics - 10/04/22 0939        Post  Biometrics   Height 5' 5.5" (1.664 m)    Weight 239 lb (108.4 kg)    BMI (Calculated) 39.15    Single Leg Stand 30 seconds             Nutrition Therapy Plan and Nutrition Goals:  Nutrition Therapy & Goals - 08/14/22 1145       Intervention Plan   Intervention Prescribe, educate and counsel regarding individualized specific dietary modifications aiming towards targeted core components such as weight, hypertension, lipid management, diabetes, heart failure and other comorbidities.  Expected Outcomes Short Term Goal: Understand basic principles of dietary content, such as calories, fat, sodium, cholesterol and nutrients.;Short Term Goal: A plan has been developed with personal nutrition goals set during dietitian appointment.;Long Term Goal: Adherence to prescribed nutrition plan.             Nutrition Assessments:  MEDIFICTS Score Key: ?70 Need to make dietary changes  40-70 Heart Healthy Diet ? 40 Therapeutic Level Cholesterol Diet  Flowsheet Row Cardiac Rehab from 10/04/2022 in [facility] Cardiac and Pulmonary Rehab  Picture Your Plate Total Score on Discharge 68      Picture Your Plate Scores: <96 Unhealthy dietary pattern with much room for improvement. 41-50 Dietary pattern unlikely to meet recommendations for good health and room for improvement. 51-60 More healthful dietary pattern, with some room  for improvement.  >60 Healthy dietary pattern, although there may be some specific behaviors that could be improved.    Nutrition Goals Re-Evaluation:  Nutrition Goals Re-Evaluation     Row Name 09/27/22 0758             Goals   Current Weight 238 lb (108 kg)       Comment Patient defers RD appointment.                Nutrition Goals Re-Evaluation     Row Name 09/27/22 0758             Goals   Current Weight 238 lb (108 kg)       Comment Patient defers RD appointment.                Nutrition Goals Discharge (Final Nutrition Goals Re-Evaluation):  Nutrition Goals Re-Evaluation - 09/27/22 0758       Goals   Current Weight 238 lb (108 kg)    Comment Patient defers RD appointment.             Psychosocial: Target Goals: Acknowledge presence or absence of significant depression and/or stress, maximize coping skills, provide positive support system. Participant is able to verbalize types and ability to use techniques and skills needed for reducing stress and depression.   Education: Stress, Anxiety, and Depression - Group verbal and visual presentation to define topics covered.  Reviews how body is impacted by stress, anxiety, and depression.  Also discusses healthy ways to reduce stress and to treat/manage anxiety and depression.  Written material given at graduation.   Education: Sleep Hygiene -Provides group verbal and written instruction about how sleep can affect your health.  Define sleep hygiene, discuss sleep cycles and impact of sleep habits. Review good sleep hygiene tips.    Initial Review & Psychosocial Screening:  Initial Psych Review & Screening - 08/03/22 1550       Initial Review   Current issues with None Identified      Family Dynamics   Good Support System? Yes   Wife            Quality of Life Scores:   Quality of Life - 10/04/22 0745       Quality of Life Scores   Health/Function Pre 21.02 %    Health/Function Post  28.4 %    Health/Function % Change 35.11 %    Socioeconomic Pre 23.29 %    Socioeconomic Post 30 %    Socioeconomic % Change  28.81 %    Psych/Spiritual Pre 17.79 %    Psych/Spiritual Post 30 %    Psych/Spiritual % Change 68.63 %  Family Pre 25.2 %    Family Post 27.4 %    Family % Change 8.73 %    GLOBAL Pre 21.51 %    GLOBAL Post 28.91 %    GLOBAL % Change 34.4 %            Scores of 19 and below usually indicate a poorer quality of life in these areas.  A difference of  2-3 points is a clinically meaningful difference.  A difference of 2-3 points in the total score of the Quality of Life Index has been associated with significant improvement in overall quality of life, self-image, physical symptoms, and general health in studies assessing change in quality of life.  PHQ-9: Review Flowsheet       10/04/2022 08/14/2022  Depression screen PHQ 2/9  Decreased Interest 0 0  Down, Depressed, Hopeless 0 1  PHQ - 2 Score 0 1  Altered sleeping 0 0  Tired, decreased energy 1 0  Change in appetite 1 0  Feeling bad or failure about yourself  0 1  Trouble concentrating 0 0  Moving slowly or fidgety/restless 0 0  Suicidal thoughts 0 0  PHQ-9 Score 2 2  Difficult doing work/chores - Not difficult at all    Details           Interpretation of Total Score  Total Score Depression Severity:  1-4 = Minimal depression, 5-9 = Mild depression, 10-14 = Moderate depression, 15-19 = Moderately severe depression, 20-27 = Severe depression   Psychosocial Evaluation and Intervention:  Psychosocial Evaluation - 08/03/22 1550       Psychosocial Evaluation & Interventions   Comments Branton "Rocco" is coming to cardiac rehab post NSTEMI w/ stent. He reports no concerns with stress and he is back to work. He reports a good support system including his wife. He is feeling back to baseline and has no barriers to attending the program.    Expected Outcomes Short: Attend cardiac rehab for education  and exercise. Long: Develop and maintain positive self care habits.    Continue Psychosocial Services  Follow up required by staff             Psychosocial Re-Evaluation:  Psychosocial Re-Evaluation     Row Name 09/27/22 0801             Psychosocial Re-Evaluation   Current issues with None Identified       Comments Patient reports no issues with their current mental states, sleep, stress, depression or anxiety. Will follow up with patient in a few weeks for any changes.       Expected Outcomes Short: Continue to exercise regularly to support mental health and notify staff of any changes. Long: maintain mental health and well being through teaching of rehab or prescribed medications independently.       Interventions Encouraged to attend Cardiac Rehabilitation for the exercise       Continue Psychosocial Services  Follow up required by staff                Psychosocial Re-Evaluation     Row Name 09/27/22 0801             Psychosocial Re-Evaluation   Current issues with None Identified       Comments Patient reports no issues with their current mental states, sleep, stress, depression or anxiety. Will follow up with patient in a few weeks for any changes.       Expected Outcomes Short: Continue to  exercise regularly to support mental health and notify staff of any changes. Long: maintain mental health and well being through teaching of rehab or prescribed medications independently.       Interventions Encouraged to attend Cardiac Rehabilitation for the exercise       Continue Psychosocial Services  Follow up required by staff                Psychosocial Discharge (Final Psychosocial Re-Evaluation):  Psychosocial Re-Evaluation - 09/27/22 0801       Psychosocial Re-Evaluation   Current issues with None Identified    Comments Patient reports no issues with their current mental states, sleep, stress, depression or anxiety. Will follow up with patient in a few weeks  for any changes.    Expected Outcomes Short: Continue to exercise regularly to support mental health and notify staff of any changes. Long: maintain mental health and well being through teaching of rehab or prescribed medications independently.    Interventions Encouraged to attend Cardiac Rehabilitation for the exercise    Continue Psychosocial Services  Follow up required by staff             Vocational Rehabilitation: Provide vocational rehab assistance to qualifying candidates.   Vocational Rehab Evaluation & Intervention:  Vocational Rehab - 08/03/22 1550       Initial Vocational Rehab Evaluation & Intervention   Assessment shows need for Vocational Rehabilitation No             Education: Education Goals: Education classes will be provided on a variety of topics geared toward better understanding of heart health and risk factor modification. Participant will state understanding/return demonstration of topics presented as noted by education test scores.  Learning Barriers/Preferences:  Learning Barriers/Preferences - 08/03/22 1550       Learning Barriers/Preferences   Learning Barriers None    Learning Preferences None             General Cardiac Education Topics:  AED/CPR: - Group verbal and written instruction with the use of models to demonstrate the basic use of the AED with the basic ABC's of resuscitation.   Anatomy and Cardiac Procedures: - Group verbal and visual presentation and models provide information about basic cardiac anatomy and function. Reviews the testing methods done to diagnose heart disease and the outcomes of the test results. Describes the treatment choices: Medical Management, Angioplasty, or Coronary Bypass Surgery for treating various heart conditions including Myocardial Infarction, Angina, Valve Disease, and Cardiac Arrhythmias.  Written material given at graduation. Flowsheet Row Cardiac Rehab from 09/06/2022 in [facility] Cardiac and  Pulmonary Rehab  Education need identified 08/14/22       Medication Safety: - Group verbal and visual instruction to review commonly prescribed medications for heart and lung disease. Reviews the medication, class of the drug, and side effects. Includes the steps to properly store meds and maintain the prescription regimen.  Written material given at graduation.   Intimacy: - Group verbal instruction through game format to discuss how heart and lung disease can affect sexual intimacy. Written material given at graduation..   Know Your Numbers and Heart Failure: - Group verbal and visual instruction to discuss disease risk factors for cardiac and pulmonary disease and treatment options.  Reviews associated critical values for Overweight/Obesity, Hypertension, Cholesterol, and Diabetes.  Discusses basics of heart failure: signs/symptoms and treatments.  Introduces Heart Failure Zone chart for action plan for heart failure.  Written material given at graduation.   Infection Prevention: - Provides  verbal and written material to individual with discussion of infection control including proper hand washing and proper equipment cleaning during exercise session. Flowsheet Row Cardiac Rehab from 09/06/2022 in [facility] Cardiac and Pulmonary Rehab  Date 08/14/22  Educator [facility]  Instruction Review Code 1- Verbalizes Understanding       Falls Prevention: - Provides verbal and written material to individual with discussion of falls prevention and safety. Flowsheet Row Cardiac Rehab from 09/06/2022 in [facility] Cardiac and Pulmonary Rehab  Date 08/14/22  Educator [facility]  Instruction Review Code 1- Verbalizes Understanding       Other: -Provides group and verbal instruction on various topics (see comments) Flowsheet Row Cardiac Rehab from 09/06/2022 in [facility] Cardiac and Pulmonary Rehab  Date 09/06/22  Educator [employer]  Instruction Review Code 1- Verbalizes Understanding       Knowledge  Questionnaire Score:  Knowledge Questionnaire Score - 10/04/22 0745       Knowledge Questionnaire Score   Pre Score 20/26    Post Score 22/26             Core Components/Risk Factors/Patient Goals at Admission:  Personal Goals and Risk Factors at Admission - 08/14/22 1146       Core Components/Risk Factors/Patient Goals on Admission    Weight Management Yes;Obesity;Weight Loss    Intervention Weight Management: Develop a combined nutrition and exercise program designed to reach desired caloric intake, while maintaining appropriate intake of nutrient and fiber, sodium and fats, and appropriate energy expenditure required for the weight goal.;Weight Management: Provide education and appropriate resources to help participant work on and attain dietary goals.;Weight Management/Obesity: Establish reasonable short term and long term weight goals.;Obesity: Provide education and appropriate resources to help participant work on and attain dietary goals.    Admit Weight 236 lb 6.4 oz (107.2 kg)    Goal Weight: Short Term 230 lb (104.3 kg)    Goal Weight: Long Term 225 lb (102.1 kg)    Expected Outcomes Short Term: Continue to assess and modify interventions until short term weight is achieved;Long Term: Adherence to nutrition and physical activity/exercise program aimed toward attainment of established weight goal;Weight Loss: Understanding of general recommendations for a balanced deficit meal plan, which promotes 1-2 lb weight loss per week and includes a negative energy balance of [phone] kcal/d;Understanding recommendations for meals to include 15-35% energy as protein, 25-35% energy from fat, 35-60% energy from carbohydrates, less than 200mg  of dietary cholesterol, 20-35 gm of total fiber daily;Understanding of distribution of calorie intake throughout the day with the consumption of 4-5 meals/snacks    Hypertension Yes    Intervention Provide education on lifestyle modifcations including  regular physical activity/exercise, weight management, moderate sodium restriction and increased consumption of fresh fruit, vegetables, and low fat dairy, alcohol moderation, and smoking cessation.;Monitor prescription use compliance.    Expected Outcomes Short Term: Continued assessment and intervention until BP is < 140/36mm HG in hypertensive participants. < 130/67mm HG in hypertensive participants with diabetes, heart failure or chronic kidney disease.;Long Term: Maintenance of blood pressure at goal levels.    Lipids Yes    Intervention Provide education and support for participant on nutrition & aerobic/resistive exercise along with prescribed medications to achieve LDL 70mg , HDL >40mg .    Expected Outcomes Short Term: Participant states understanding of desired cholesterol values and is compliant with medications prescribed. Participant is following exercise prescription and nutrition guidelines.;Long Term: Cholesterol controlled with medications as prescribed, with individualized exercise RX and with personalized nutrition plan. Value goals:  LDL < 70mg , HDL > 40 mg.             Education:Diabetes - Individual verbal and written instruction to review signs/symptoms of diabetes, desired ranges of glucose level fasting, after meals and with exercise. Acknowledge that pre and post exercise glucose checks will be done for 3 sessions at entry of program.   Core Components/Risk Factors/Patient Goals Review:   Goals and Risk Factor Review     Row Name 09/27/22 0802             Core Components/Risk Factors/Patient Goals Review   Personal Goals Review Weight Management/Obesity       Review Alaster would like to continue to lose weight. He is going to Graduate this friday and continue to exercise at home with his son. He states he has no questions about his medications or exercise prescription.       Expected Outcomes Short: Graduate [facility]. Long: Maintain exercise independently.                 Goals and Risk Factor Review     Row Name 09/27/22 0802             Core Components/Risk Factors/Patient Goals Review   Personal Goals Review Weight Management/Obesity       Review Thad would like to continue to lose weight. He is going to Graduate this friday and continue to exercise at home with his son. He states he has no questions about his medications or exercise prescription.       Expected Outcomes Short: Graduate [facility]. Long: Maintain exercise independently.                Core Components/Risk Factors/Patient Goals at Discharge (Final Review):   Goals and Risk Factor Review - 09/27/22 0802       Core Components/Risk Factors/Patient Goals Review   Personal Goals Review Weight Management/Obesity    Review Cloyce would like to continue to lose weight. He is going to Graduate this friday and continue to exercise at home with his son. He states he has no questions about his medications or exercise prescription.    Expected Outcomes Short: Graduate [facility]. Long: Maintain exercise independently.             ITP Comments:  ITP Comments     Row Name 08/03/22 1547 08/14/22 1141 08/23/22 1126 09/04/22 0757 09/19/22 1500   ITP Comments Initial phone call completed. Dx can be found in [facility] 5/14. EP orientation scheduled for 6/3 at 10:00 am. Completed and gym orientation. Initial ITP created and sent for review to Dr. Bethann Punches, Medical Director.  Pt wanted to talk to doctor about rehab prior to scheduling class/exercise time.  He will call to let us know. 30 Day review completed. Medical Director ITP review done, changes made as directed, and signed approval by Medical Director.    new to program First full day of exercise!  Patient was oriented to gym and equipment including functions, settings, policies, and procedures.  Patient's individual exercise prescription and treatment plan were reviewed.  All starting workloads were established based on the results  of the 6 minute walk test done at initial orientation visit.  The plan for exercise progression was also introduced and progression will be customized based on patient's performance and goals. 30 Day review completed. Medical Director ITP review done, changes made as directed, and signed approval by Medical Director.   new to program    Row Name 10/04/22  [id number]           ITP Comments Jemuel graduated today from  rehab with 12 sessions completed.  Details of the patient's exercise prescription and what He needs to do in order to continue the prescription and progress were discussed with patient.  Patient was given a copy of prescription and goals.  Patient verbalized understanding.  Marcy plans to continue to exercise at [employer].                ITP Comments     Row Name 08/03/22 1547 08/14/22 1141 08/23/22 1126 09/04/22 0757 09/19/22 1500   ITP Comments Initial phone call completed. Dx can be found in [facility] 5/14. EP orientation scheduled for 6/3 at 10:00 am. Completed and gym orientation. Initial ITP created and sent for review to Dr. Bethann Punches, Medical Director.  Pt wanted to talk to doctor about rehab prior to scheduling class/exercise time.  He will call to let us know. 30 Day review completed. Medical Director ITP review done, changes made as directed, and signed approval by Medical Director.    new to program First full day of exercise!  Patient was oriented to gym and equipment including functions, settings, policies, and procedures.  Patient's individual exercise prescription and treatment plan were reviewed.  All starting workloads were established based on the results of the 6 minute walk test done at initial orientation visit.  The plan for exercise progression was also introduced and progression will be customized based on patient's performance and goals. 30 Day review completed. Medical Director ITP review done, changes made as directed, and signed approval by Medical Director.   new  to program    Row Name 10/04/22 0734           ITP Comments Selma graduated today from  rehab with 12 sessions completed.  Details of the patient's exercise prescription and what He needs to do in order to continue the prescription and progress were discussed with patient.  Patient was given a copy of prescription and goals.  Patient verbalized understanding.  Duffy plans to continue to exercise at [employer].                ITP Comments     Row Name 08/03/22 1547 08/14/22 1141 08/23/22 1126 09/04/22 0757 09/19/22 1500   ITP Comments Initial phone call completed. Dx can be found in [facility] 5/14. EP orientation scheduled for 6/3 at 10:00 am. Completed and gym orientation. Initial ITP created and sent for review to Dr. Bethann Punches, Medical Director.  Pt wanted to talk to doctor about rehab prior to scheduling class/exercise time.  He will call to let us know. 30 Day review completed. Medical Director ITP review done, changes made as directed, and signed approval by Medical Director.    new to program First full day of exercise!  Patient was oriented to gym and equipment including functions, settings, policies, and procedures.  Patient's individual exercise prescription and treatment plan were reviewed.  All starting workloads were established based on the results of the 6 minute walk test done at initial orientation visit.  The plan for exercise progression was also introduced and progression will be customized based on patient's performance and goals. 30 Day review completed. Medical Director ITP review done, changes made as directed, and signed approval by Medical Director.   new to program    Row Name 10/04/22 0734           ITP Comments  Turrell graduated today from  rehab with 12 sessions completed.  Details of the patient's exercise prescription and what He needs to do in order to continue the prescription and progress were discussed with patient.  Patient was given a copy of prescription and  goals.  Patient verbalized understanding.  Isiah plans to continue to exercise at [employer].                Comments: Discharge ITP

## 2022-10-04 NOTE — Progress Notes (Signed)
Discharge Summary: Kevin Clark (DOB 1980/04/18)     Kevin Clark graduated today from  rehab with 12 sessions completed.  Details of the patient's exercise prescription and what He needs to do in order to continue the prescription and progress were discussed with patient.  Patient was given a copy of prescription and goals.  Patient verbalized understanding.  [name] plans to continue to exercise at [employer].   6 Minute Walk     Row Name 08/14/22 1141 10/04/22 0937       6 Minute Walk   Phase Initial Discharge    Distance 1400 feet 1400 feet    Distance % Change -- 0 %    Distance Feet Change -- 0 ft    Walk Time 6 minutes 6 minutes    # of Rest Breaks 0 0    MPH 2.65 2.65    METS 3.99 4.01    RPE 7 7    VO2 Peak 13.98 14.05    Symptoms No No    Resting HR 70 bpm 63 bpm    Resting BP 122/62 110/78    Resting Oxygen Saturation  98 % 96 %    Exercise Oxygen Saturation  during 6 min walk 96 % 96 %    Max Ex. HR 105 bpm 93 bpm    Max Ex. BP 134/66 162/80    2 Minute Post BP 134/70 124/80             6 Minute Walk     Row Name 08/14/22 1141 10/04/22 0937       6 Minute Walk   Phase Initial Discharge    Distance 1400 feet 1400 feet    Distance % Change -- 0 %    Distance Feet Change -- 0 ft    Walk Time 6 minutes 6 minutes    # of Rest Breaks 0 0    MPH 2.65 2.65    METS 3.99 4.01    RPE 7 7    VO2 Peak 13.98 14.05    Symptoms No No    Resting HR 70 bpm 63 bpm    Resting BP 122/62 110/78    Resting Oxygen Saturation  98 % 96 %    Exercise Oxygen Saturation  during 6 min walk 96 % 96 %    Max Ex. HR 105 bpm 93 bpm    Max Ex. BP 134/66 162/80    2 Minute Post BP 134/70 124/80             6 Minute Walk     Row Name 08/14/22 1141 10/04/22 0937       6 Minute Walk   Phase Initial Discharge    Distance 1400 feet 1400 feet    Distance % Change -- 0 %    Distance Feet Change -- 0 ft    Walk Time 6 minutes 6 minutes    # of Rest Breaks 0 0    MPH 2.65 2.65     METS 3.99 4.01    RPE 7 7    VO2 Peak 13.98 14.05    Symptoms No No    Resting HR 70 bpm 63 bpm    Resting BP 122/62 110/78    Resting Oxygen Saturation  98 % 96 %    Exercise Oxygen Saturation  during 6 min walk 96 % 96 %    Max Ex. HR 105 bpm 93 bpm    Max Ex. BP 134/66 162/80  2 Minute Post BP 134/70 124/80

## 2022-10-04 NOTE — Progress Notes (Signed)
Daily Session Note  Patient Details  Name: Kevin Clark MRN: [id number] Date of Birth: 06/12/1980 Referring Provider:   Flowsheet Row Cardiac Rehab from 08/14/2022 in [facility] Cardiac [facility] Pulmonary [facility]  Referring Provider End, Cristal Deer MD       Encounter Date: 10/04/2022  Check In:  Session Check In - 10/04/22 0726       Check-In   Supervising physician immediately available to respond to emergencies See telemetry face sheet for immediately available ER MD    Location [facility]-Cardiac & Pulmonary Rehab    Staff Present Lanny Hurst, RN, ADN;Joseph Hood, RCP,RRT,BSRT;Cora Collum, RN, BSN, CCRP;Other   Rory Percy, MS   Virtual Visit No    Medication changes reported     No    Fall or balance concerns reported    No    Warm-up and Cool-down Performed on first and last piece of equipment    Resistance Training Performed Yes    VAD Patient? No    PAD/SET Patient? No      Pain Assessment   Currently in Pain? No/denies                Social History   Tobacco Use  Smoking Status Some Days   Types: Cigars  Smokeless Tobacco Never    Goals Met:  Independence with exercise equipment Exercise tolerated well No report of concerns or symptoms today Strength training completed today  Goals Unmet:  Not Applicable  Comments:    6 Minute Walk     Row Name 08/14/22 1141 10/04/22 0937       6 Minute Walk   Phase Initial Discharge    Distance 1400 feet 1400 feet    Distance % Change -- 0 %    Distance Feet Change -- 0 ft    Walk Time 6 minutes 6 minutes    # of Rest Breaks 0 0    MPH 2.65 2.65    METS 3.99 4.01    RPE 7 7    VO2 Peak 13.98 14.05    Symptoms No No    Resting HR 70 bpm 63 bpm    Resting BP 122/62 110/78    Resting Oxygen Saturation  98 % 96 %    Exercise Oxygen Saturation  during 6 min walk 96 % 96 %    Max Ex. HR 105 bpm 93 bpm    Max Ex. BP 134/66 162/80    2 Minute Post BP 134/70 124/80             6 Minute Walk     Row Name  08/14/22 1141 10/04/22 0937       6 Minute Walk   Phase Initial Discharge    Distance 1400 feet 1400 feet    Distance % Change -- 0 %    Distance Feet Change -- 0 ft    Walk Time 6 minutes 6 minutes    # of Rest Breaks 0 0    MPH 2.65 2.65    METS 3.99 4.01    RPE 7 7    VO2 Peak 13.98 14.05    Symptoms No No    Resting HR 70 bpm 63 bpm    Resting BP 122/62 110/78    Resting Oxygen Saturation  98 % 96 %    Exercise Oxygen Saturation  during 6 min walk 96 % 96 %    Max Ex. HR 105 bpm 93 bpm    Max Ex. BP 134/66  162/80    2 Minute Post BP 134/70 124/80            Kevin Clark graduated today from  rehab with 12 sessions completed.  Details of the patient's exercise prescription and what He needs to do in order to continue the prescription and progress were discussed with patient.  Patient was given a copy of prescription and goals.  Patient verbalized understanding.  Kevin Clark plans to continue to exercise at [employer].    Dr. Bethann Punches is Medical Director for [facility] Cardiac Rehabilitation.  Dr. Vida Rigger is Medical Director for [facility] Pulmonary Rehabilitation.

## 2022-10-05 ENCOUNTER — Other Ambulatory Visit
Admission: RE | Admit: 2022-10-05 | Discharge: 2022-10-05 | Disposition: A | Discharge status: Home / Self Care | Payer: 59 | Source: Ambulatory Visit | Attending: Cardiology | Admitting: Cardiology

## 2022-10-05 DIAGNOSIS — E782 Mixed hyperlipidemia: Secondary | ICD-10-CM | POA: Diagnosis not present

## 2022-10-05 DIAGNOSIS — I214 Non-ST elevation (NSTEMI) myocardial infarction: Secondary | ICD-10-CM | POA: Diagnosis not present

## 2022-10-05 DIAGNOSIS — E669 Obesity, unspecified: Secondary | ICD-10-CM | POA: Insufficient documentation

## 2022-10-05 LAB — LIPID PANEL
Cholesterol: 94 mg/dL (ref 0–200)
HDL: 47 mg/dL (ref >40)
LDL Cholesterol: 24 mg/dL (ref 0–99)
Total CHOL/HDL Ratio: 2 RATIO
Triglycerides: 116 mg/dL (ref <150)
VLDL: 23 mg/dL (ref 0–40)

## 2022-10-05 LAB — HEPATIC FUNCTION PANEL
ALT: 28 U/L (ref 0–44)
AST: 23 U/L (ref 15–41)
Albumin: 3.8 g/dL (ref 3.5–5.0)
Alkaline Phosphatase: 70 U/L (ref 38–126)
Bilirubin, Direct: 0.2 mg/dL (ref 0.0–0.2)
Indirect Bilirubin: 1.1 mg/dL — ABNORMAL HIGH (ref 0.3–0.9)
Total Bilirubin: 1.3 mg/dL — ABNORMAL HIGH (ref 0.3–1.2)
Total Protein: 7.9 g/dL (ref 6.5–8.1)

## 2022-10-09 ENCOUNTER — Other Ambulatory Visit: Payer: Self-pay

## 2022-10-09 DIAGNOSIS — Z79899 Other long term (current) drug therapy: Secondary | ICD-10-CM

## 2022-10-09 NOTE — Progress Notes (Signed)
Huge improvement with cholesterol. Hepatic function is stable with some slightly elevated results. Recommend repeating hepatic panel in 8 weeks. Continue current medication regimen without changes at this time.

## 2022-10-09 NOTE — Telephone Encounter (Signed)
Roller coasters are typically advised against.  Be careful in the heat and sun in [place] as well.  Make sure that she stay hydrated and use sunscreen as some of the medications can make you burn easier during outdoor activities.

## 2022-10-13 ENCOUNTER — Other Ambulatory Visit: Payer: Self-pay

## 2022-10-16 ENCOUNTER — Other Ambulatory Visit: Payer: Self-pay

## 2022-10-17 ENCOUNTER — Ambulatory Visit: Payer: 59 | Attending: Cardiology | Admitting: Cardiology

## 2022-10-17 ENCOUNTER — Encounter: Payer: Self-pay | Admitting: Cardiology

## 2022-10-17 VITALS — BP 134/88 | HR 59 | Ht 65.0 in | Wt 239.8 lb

## 2022-10-17 DIAGNOSIS — R5383 Other fatigue: Secondary | ICD-10-CM | POA: Diagnosis not present

## 2022-10-17 DIAGNOSIS — E782 Mixed hyperlipidemia: Secondary | ICD-10-CM

## 2022-10-17 DIAGNOSIS — I2510 Atherosclerotic heart disease of native coronary artery without angina pectoris: Secondary | ICD-10-CM | POA: Diagnosis not present

## 2022-10-17 DIAGNOSIS — R002 Palpitations: Secondary | ICD-10-CM

## 2022-10-17 DIAGNOSIS — E669 Obesity, unspecified: Secondary | ICD-10-CM | POA: Diagnosis not present

## 2022-10-17 MED ORDER — METOPROLOL SUCCINATE ER 25 MG PO TB24: 12.5000 mg | ORAL_TABLET | Freq: Every day | ORAL | Status: DC

## 2022-10-17 NOTE — Progress Notes (Signed)
Cardiology Office Note:  .   Date:  10/17/2022  ID:  [name], DOB [birth year], MRN [id number] PCP: Lupita Dawn, MD  [facility] Providers Cardiologist:  Yvonne Kendall, MD    History of Present Illness: .   Kevin Clark is a 42 y.o. male with a past medical history of hyperlipidemia, obesity, former smoker, intermittent alcohol use, and coronary artery disease, who is here today to follow-up on his coronary artery disease.  He had presented to [facility] emergency department with complaints of chest pain radiating down his left arm that started the night before.  He denied any associated symptoms tried taking Tylenol to any cold bed.  Chest discomfort recurred and he presented to the emergency department.  Blood pressure was elevated on arrival at 162/118.  High-sensitivity troponins trended and peaked to 314.  Left heart catheterization revealed single-vessel coronary artery disease with thrombotic 99% stenosis of the inferior branch of the OM1, 50 to 60% mid LAD disease 20 to 30% stenosis of the mid/distal LMCA, normal left ventricular filling pressure with successful PCI/DES to the inferior branch of OM1.  He was recommended for dual antiplatelet therapy with aspirin and Brilinta for minimum of 12 months and aggressive secondary prevention of his coronary artery disease.  Echocardiogram revealed an EF 55 to 60%, no RWMA, trivial mitral regurgitation.  He was last seen in clinic 09/13/2022 accompanied by his wife.  He had a different feeling sick chest since discharge but no pain or associated symptoms was just feeling.  Was not related to exertion.  He was having a little bit more stress due to some automotive issues.  Has been compliant with his medications.  Was encouraged to continue with cardiac rehab.  He was started on a PCSK9 inhibitor with repeat lipid and hepatic panel ordered.  He returns to clinic today stating overall he has been doing fairly well from a cardiac  perspective.  He does report some increased fatigue and is wondering if it is related to his current medications that he is on.  He continues to endorse occasional palpitations but nothing that is said to have from what he had previously had since high school.  He had tried to decrease his amount of caffeine and is not related stating that he has these episodes approximately 1 time per week.  He has done well with cardiac rehab but has been released and is back to work without incident.  Denies any chest pain, shortness of breath, peripheral edema.  Stated that he is trying to work on losing weight and continues to remain active.  Denies any recent hospitalizations or visits to the emergency department.  ROS: 10 point review of systems has been reviewed and considered negative with exception of what is been listed in the HPI  Studies Reviewed: Marland Kitchen        LHC 07/26/22 Conclusions: Severe single-vessel coronary artery disease with thrombotic 99% stenosis of inferior branch of OM1.  There is also 50-60% mid LAD disease and 20-30% stenosis of the mid/distal LMCA. Normal left ventricular filling pressure. Successful PCI to inferior branch of OM1 using Onyx Frontier 2.5 x 18 mm drug-eluting stent (postdilated to 2.8 mm) with 0% residual stenosis and TIMI-3 flow.   Recommendations: Dual antiplatelet therapy with aspirin and ticagrelor for at least 12 months. Aggressive secondary prevention of coronary artery disease. Medical therapy of moderate LAD disease.  If the patient has recurrent chest pain, functional assessment to determine hemodynamic significance of LAD stenosis  should be considered.   TTE 07/25/22 1. Left ventricular ejection fraction, by estimation, is 55 to 60%. The  left ventricle has normal function. The left ventricle has no regional  wall motion abnormalities. Left ventricular diastolic parameters were  normal.   2. Right ventricular systolic function is normal. The right ventricular   size is normal.   3. The mitral valve is normal in structure. Trivial mitral valve  regurgitation. No evidence of mitral stenosis.   4. The aortic valve is tricuspid. Aortic valve regurgitation is not  visualized. No aortic stenosis is present.   5. The inferior vena cava is normal in size with greater than 50%  respiratory variability, suggesting right atrial pressure of 3 mmHg. Risk Assessment/Calculations:             Physical Exam:   VS:  BP 134/88 (BP Location: Left Arm, Patient Position: Sitting, Cuff Size: Large)   Pulse (!) 59   Ht 5\' 5"  (1.651 m)   Wt 239 lb 12.8 oz (108.8 kg)   SpO2 96%   BMI 39.90 kg/m    Wt Readings from Last 3 Encounters:  10/17/22 239 lb 12.8 oz (108.8 kg)  10/04/22 239 lb (108.4 kg)  08/14/22 239 lb 3.2 oz (108.5 kg)    GEN: Well nourished, well developed in no acute distress NECK: No JVD; No carotid bruits CARDIAC: RRR, no murmurs, rubs, gallops RESPIRATORY:  Clear to auscultation without rales, wheezing or rhonchi  ABDOMEN: Soft, non-tender, non-distended EXTREMITIES:  No edema; No deformity   ASSESSMENT AND PLAN: .   Coronary artery disease native coronary artery without angina with recent NSTEMI status post PCI/DES of the inferior branch of OM1 on 07/26/2022.  Medical therapy was favored for moderate LAD disease with consideration for functional assessment to determine the significance of LAD stenosis if he had recurrent angina.  He continues to remain chest pain-free.  He is continued on DAPT and aspirin 81 mg and Brilinta 90 mg twice daily for minimum of 12 months.  He is also maintained on Toprol-XL and atorvastatin.  His atorvastatin was decreased at his last visit and he was started on Repatha for elevated LP(a).  He has graduated from cardiac rehab and has been encouraged to continue with this activity and has been cleared to go back to the gym.  He is to continue with his current medication regimen with some slight changes today and with  aggressive secondary prevention.  Mixed hyperlipidemia with an LDL 24 on 10/05/2022 down from 131 after being started on Repatha.  Continued on his current medication regimen without changes.  He does have follow-up blood work with hepatic panel with his PCP at his yearly physical next week.  Increasing fatigue with heart rate drop from previous visit in the 70s to the 50s today.  We have decreased his Toprol-XL from 25 mg daily to 12.5 mg daily.  He has been encouraged that if his palpitations worsen he will need to increase with a half a dose back to hold his.  He stated that he understood recommendations.  Palpitations occurred approximately 1 time a week.  Not related to activity or caffeine intake.  Patient is currently committed to go on vacation so we are not starting him on a ZIO to rule out arrhythmia at this time.  With his worsening fatigue we have decreased his Toprol-XL but if his palpitations increase he has been encouraged to increase the dose back up and if need be we can make  medication changes on his return.  Obesity with a BMI 39.9.  Encouraged to continue with increasing activity and dietary changes.       Dispo: Patient to return to clinic to see MD/APP in 3 months or sooner if needed  Signed,  , NP

## 2022-10-17 NOTE — Patient Instructions (Addendum)
Medication Instructions:  Your physician recommends the following medication changes.  DECREASE: Metoprolol Succinate 12.5 mg daily  *If you need a refill on your cardiac medications before your next appointment, please call your pharmacy*   Lab Work: none If you have labs (blood work) drawn today and your tests are completely normal, you will receive your results only by: MyChart Message (if you have MyChart) OR A paper copy in the mail If you have any lab test that is abnormal or we need to change your treatment, we will call you to review the results.   Testing/Procedures: none  Follow-Up: At [facility], you and your health needs are our priority.  As part of our continuing mission to provide you with exceptional heart care, we have created designated Provider Care Teams.  These Care Teams include your primary Cardiologist (physician) and Advanced Practice Providers (APPs -  Physician Assistants and Nurse Practitioners) who all work together to provide you with the care you need, when you need it.  We recommend signing up for the patient portal called "MyChart".  Sign up information is provided on this After Visit Summary.  MyChart is used to connect with patients for Virtual Visits (Telemedicine).  Patients are able to view lab/test results, encounter notes, upcoming appointments, etc.  Non-urgent messages can be sent to your provider as well.   To learn more about what you can do with MyChart, go to [email or web address].    Your next appointment:   3 month(s)  Provider:   You may see Yvonne Kendall, MD or one of the following Advanced Practice Providers on your designated Care Team:   Charlsie Quest, NP

## 2022-10-30 DIAGNOSIS — Z0000 Encounter for general adult medical examination without abnormal findings: Secondary | ICD-10-CM | POA: Diagnosis not present

## 2022-10-30 DIAGNOSIS — R002 Palpitations: Secondary | ICD-10-CM | POA: Diagnosis not present

## 2022-11-08 ENCOUNTER — Other Ambulatory Visit: Payer: Self-pay | Admitting: Cardiology

## 2022-11-09 ENCOUNTER — Other Ambulatory Visit: Payer: Self-pay

## 2022-11-09 MED ORDER — REPATHA SURECLICK 140 MG/ML ~~LOC~~ SOAJ
140.0000 mg | SUBCUTANEOUS | 2 refills | Status: DC
  Filled 2022-11-09: qty 2, 28d supply, fill #0
  Filled 2022-12-05: qty 2, 28d supply, fill #1
  Filled 2023-01-03: qty 2, 28d supply, fill #2

## 2022-11-09 MED ORDER — METOPROLOL SUCCINATE ER 25 MG PO TB24
12.5000 mg | ORAL_TABLET | Freq: Every day | ORAL | 3 refills | Status: DC
  Filled 2022-11-09: qty 45, 90d supply, fill #0
  Filled 2022-12-05 – 2023-01-18 (×4): qty 45, 90d supply, fill #1

## 2022-12-05 ENCOUNTER — Other Ambulatory Visit: Payer: Self-pay

## 2022-12-06 ENCOUNTER — Other Ambulatory Visit: Payer: Self-pay

## 2022-12-07 ENCOUNTER — Other Ambulatory Visit: Payer: Self-pay

## 2023-01-03 ENCOUNTER — Other Ambulatory Visit: Payer: Self-pay

## 2023-01-04 ENCOUNTER — Other Ambulatory Visit: Payer: Self-pay

## 2023-01-05 ENCOUNTER — Other Ambulatory Visit: Payer: Self-pay

## 2023-01-18 ENCOUNTER — Other Ambulatory Visit: Payer: Self-pay

## 2023-01-23 ENCOUNTER — Ambulatory Visit: Payer: 59 | Admitting: Cardiology

## 2023-01-27 ENCOUNTER — Other Ambulatory Visit: Payer: Self-pay | Admitting: Cardiology

## 2023-01-29 ENCOUNTER — Other Ambulatory Visit: Payer: Self-pay

## 2023-01-29 ENCOUNTER — Encounter: Payer: Self-pay | Admitting: Cardiology

## 2023-01-29 ENCOUNTER — Ambulatory Visit: Payer: 59 | Attending: Cardiology | Admitting: Cardiology

## 2023-01-29 VITALS — BP 110/68 | HR 73 | Ht 65.0 in | Wt 248.2 lb

## 2023-01-29 DIAGNOSIS — R002 Palpitations: Secondary | ICD-10-CM

## 2023-01-29 DIAGNOSIS — E782 Mixed hyperlipidemia: Secondary | ICD-10-CM | POA: Diagnosis not present

## 2023-01-29 DIAGNOSIS — I2510 Atherosclerotic heart disease of native coronary artery without angina pectoris: Secondary | ICD-10-CM | POA: Diagnosis not present

## 2023-01-29 DIAGNOSIS — I214 Non-ST elevation (NSTEMI) myocardial infarction: Secondary | ICD-10-CM

## 2023-01-29 MED ORDER — WEGOVY 0.25 MG/0.5ML ~~LOC~~ SOAJ
0.2500 mg | SUBCUTANEOUS | 0 refills | Status: DC
  Filled 2023-01-29: qty 2, 28d supply, fill #0

## 2023-01-29 MED ORDER — REPATHA SURECLICK 140 MG/ML ~~LOC~~ SOAJ
140.0000 mg | SUBCUTANEOUS | 2 refills | Status: DC
  Filled 2023-01-29: qty 2, 28d supply, fill #0
  Filled 2023-02-25: qty 2, 28d supply, fill #1
  Filled 2023-03-27: qty 2, 28d supply, fill #2

## 2023-01-29 NOTE — Patient Instructions (Signed)
Medication Instructions:  Your physician has recommended you make the following change in your medication:   START: Semaglutide-Weight Management (WEGOVY) 0.25 MG/0.5ML SOAJ - Inject 0.25 mg into the skin once a week   *If you need a refill on your cardiac medications before your next appointment, please call your pharmacy*   Lab Work: Your provider would like for you to return this week to have the following labs drawn: CBC, BMET and Lipid panel.   Please go to [facility] [address] [address], [place] [address] You do not need an appointment.  They are open from 7:30 am-4 pm.  Lunch from 1:00 pm- 2:00 pm You DO need to be fasting.   If you have labs (blood work) drawn today and your tests are completely normal, you will receive your results only by: MyChart Message (if you have MyChart) OR A paper copy in the mail If you have any lab test that is abnormal or we need to change your treatment, we will call you to review the results.   Testing/Procedures: - None ordered   Follow-Up: At [facility], you and your health needs are our priority.  As part of our continuing mission to provide you with exceptional heart care, we have created designated Provider Care Teams.  These Care Teams include your primary Cardiologist (physician) and Advanced Practice Providers (APPs -  Physician Assistants and Nurse Practitioners) who all work together to provide you with the care you need, when you need it.  Your next appointment:   3 month(s)  Provider:   Charlsie Quest, NP    Other Instructions - None

## 2023-01-29 NOTE — Progress Notes (Signed)
Cardiology Office Note:  .   Date:  01/29/2023  ID:  Kevin Clark, DOB 06-19-1980, MRN [id number] PCP: Kevin Dawn, MD  [facility] Providers Cardiologist:  Yvonne Kendall, MD    History of Present Illness: .   Kevin Clark is a 42 y.o. male with a past medical history of hyperlipidemia, obesity, former smoker, no recent alcohol use, coronary artery disease who is here today for follow-up of coronary artery disease.   He originally presented to [facility] emergency department complaints of chest pain radiating down his left side before.  He denied any other associated symptoms.  His chest discomfort reoccurred when he presented to the emergency department.  Blood pressure was elevated on arrival at 162/118.  High-sensitivity troponin trended and peaked to 314.  Left heart catheterization revealed single-vessel coronary artery disease with thrombotic 99% stenosis of the inferior branch of the OM1, 50 to 60% mid LAD disease and 20 to 30% stenosis of the mid/distal LMCA, normal left ventricular filling pressure with successful PCI/DES to the inferior branch of OM1.  He was recommended for dual antiplatelet therapy with aspirin and Brilinta for minimum of 12 months and aggressive secondary prevention of his coronary artery disease.  Echocardiogram revealed EF of 55 to 60%, no RWMA, trivial mitral regurgitation.  In follow-up on 09/30/2022 he had been compliant with his medications.  Encouraged to continue with cardiac rehab was started on PCSK9 inhibitor with repeat lipid and hepatic panel ordered.  He was last seen in clinic 10/17/2022, doing well from a cardiac perspective he denies any anginal or concerns for cardiac decompensation.  He had done well with cardiac rehab and had been released and was back to work without incident at that time.   He returns to clinic today stating that today he has been doing well.  He had 1 episode approximately back in September on 12/01/2022 where he had some  chest tightness and pressure similar to his recent hospitalization which required the use of Nitrostat x 1 dose.  He said after that the pain resolved and he has had no further incident and not required any further nitro since that time.  On his last appointment they had a trip scheduled to go to [facility] and were riding on roller coasters which she tolerated well.  He states that he has been compliant with his medication regimen.  Denies any recent chest discomfort, shortness of breath, palpitations, or peripheral edema.  Denies any recent hospitalizations or visits to the emergency department.  ROS: 10 point review of systems reviewed and considered negative with exception was been listed in the HPI  Studies Reviewed: Marland Kitchen   EKG Interpretation Date/Time:  Monday January 29 2023 14:39:46 EST Ventricular Rate:  74 PR Interval:  152 QRS Duration:  68 QT Interval:  366 QTC Calculation: 406 R Axis:   55  Text Interpretation: Normal sinus rhythm Normal ECG When compared with ECG of 27-Jul-2022 05:27, No significant change was found Confirmed by Charlsie Quest ([id number]) on 01/29/2023 2:57:01 PM    LHC 07/26/22 Conclusions: Severe single-vessel coronary artery disease with thrombotic 99% stenosis of inferior branch of OM1.  There is also 50-60% mid LAD disease and 20-30% stenosis of the mid/distal LMCA. Normal left ventricular filling pressure. Successful PCI to inferior branch of OM1 using Onyx Frontier 2.5 x 18 mm drug-eluting stent (postdilated to 2.8 mm) with 0% residual stenosis and TIMI-3 flow.   Recommendations: Dual antiplatelet therapy with aspirin and ticagrelor for at least 12  months. Aggressive secondary prevention of coronary artery disease. Medical therapy of moderate LAD disease.  If the patient has recurrent chest pain, functional assessment to determine hemodynamic significance of LAD stenosis should be considered.   TTE 07/25/22 1. Left ventricular ejection fraction, by estimation, is  55 to 60%. The  left ventricle has normal function. The left ventricle has no regional  wall motion abnormalities. Left ventricular diastolic parameters were  normal.   2. Right ventricular systolic function is normal. The right ventricular  size is normal.   3. The mitral valve is normal in structure. Trivial mitral valve  regurgitation. No evidence of mitral stenosis.   4. The aortic valve is tricuspid. Aortic valve regurgitation is not  visualized. No aortic stenosis is present.   5. The inferior vena cava is normal in size with greater than 50%  respiratory variability, suggesting right atrial pressure of 3 mmHg. Risk Assessment/Calculations:             Physical Exam:   VS:  BP 110/68 (BP Location: Left Arm, Patient Position: Sitting, Cuff Size: Normal)   Pulse 73   Ht 5\' 5"  (1.651 m)   Wt 248 lb 3.2 oz (112.6 kg)   SpO2 97%   BMI 41.30 kg/m    Wt Readings from Last 3 Encounters:  01/29/23 248 lb 3.2 oz (112.6 kg)  10/17/22 239 lb 12.8 oz (108.8 kg)  10/04/22 239 lb (108.4 kg)    GEN: Well nourished, well developed in no acute distress NECK: No JVD; No carotid bruits CARDIAC: RRR, no murmurs, rubs, gallops RESPIRATORY:  Clear to auscultation without rales, wheezing or rhonchi  ABDOMEN: Soft, non-tender, non-distended EXTREMITIES:  No edema; No deformity   ASSESSMENT AND PLAN: .   Coronary artery disease of native coronary artery without angina.  Previously was an NSTEMI in 07/26/2022 status post PCI/DES to the inferior branch of the OM1.  Medical therapy was favored for moderate LAD disease with consideration of false clinical assessment to determine significance of LAD stenosis if he had recurrent angina.  He has only had 1 episode since his last visit which was several months back and has had no recurrence of chest discomfort since that time.  He is continued on DAPT with aspirin 81 mg and Brilinta 90 mg twice daily for minimum of 12 months.  He is also maintained on  Toprol-XL and atorvastatin.  He has been sent for a CMP and a repeat lipid this week and to follow-up since starting PCSK9 inhibitor therapy.  EKG today reveals normal sinus rhythm with a rate of 74 with no ischemic changes noted.  No further ischemic testing needed at this time.  Mixed hyperlipidemia without a recent LDL.  He is continued on his current medication regimen of atorvastatin 40 mg daily and Repatha 140 mg every 2 weeks.  He has been sent for an updated lipid panel.  Palpitations that have significantly decreased since he has decreased caffeine intake.  Will defer ZIO monitoring at this time.  He is continued on Toprol-XL 12.5 mg daily without changes made to his medication regimen today.  Morbid obesity with a BMI of 41.3.  He is encouraged to continue to increase activity and continue with his dietary changes.  With his cardiovascular history he is being started on Wegovy 0.25 mg injection once weekly for 1 month, 0.5 mg each week for a month 2, 1 mg each week from month 3, 1.7 mg each week for 1 month follow-up, and then  2.4 mg each week for if need be can stay on 1.7 mg without having to try to wean up to the 2.4 mg.        Dispo: Patient to return to clinic to see MD/APP in 3 months or sooner if needed for reevaluation of symptoms  Signed, Stephaie Dardis, NP

## 2023-01-30 ENCOUNTER — Other Ambulatory Visit: Payer: Self-pay

## 2023-01-31 ENCOUNTER — Other Ambulatory Visit: Payer: Self-pay

## 2023-01-31 DIAGNOSIS — R002 Palpitations: Secondary | ICD-10-CM | POA: Diagnosis not present

## 2023-01-31 DIAGNOSIS — I2510 Atherosclerotic heart disease of native coronary artery without angina pectoris: Secondary | ICD-10-CM

## 2023-01-31 NOTE — Telephone Encounter (Signed)
We can try Zepbound/tirzepatide 2.5 mg weekly injection x 4 weeks and then increase the dose to 5.0 mg weekly for a least 4 weeks then after that with individualized up titration.

## 2023-02-01 ENCOUNTER — Other Ambulatory Visit: Payer: Self-pay

## 2023-02-01 ENCOUNTER — Other Ambulatory Visit: Payer: Self-pay | Admitting: Cardiology

## 2023-02-01 LAB — BASIC METABOLIC PANEL
BUN/Creatinine Ratio: 11 (ref 9–20)
BUN: 12 mg/dL (ref 6–24)
CO2: 20 mmol/L (ref 20–29)
Calcium: 9.3 mg/dL (ref 8.7–10.2)
Chloride: 103 mmol/L (ref 96–106)
Creatinine, Ser: 1.09 mg/dL (ref 0.76–1.27)
Glucose: 85 mg/dL (ref 70–99)
Potassium: 4.8 mmol/L (ref 3.5–5.2)
Sodium: 139 mmol/L (ref 134–144)
eGFR: 86 mL/min/{1.73_m2} (ref >59)

## 2023-02-01 LAB — CBC
Hematocrit: 43 % (ref 37.5–51.0)
Hemoglobin: 14.3 g/dL (ref 13.0–17.7)
MCH: 30.2 pg (ref 26.6–33.0)
MCHC: 33.3 g/dL (ref 31.5–35.7)
MCV: 91 fL (ref 79–97)
Platelets: 242 10*3/uL (ref 150–450)
RBC: 4.73 x10E6/uL (ref 4.14–5.80)
RDW: 12.6 % (ref 11.6–15.4)
WBC: 7.3 10*3/uL (ref 3.4–10.8)

## 2023-02-01 LAB — LIPID PANEL
Chol/HDL Ratio: 1.8 ratio (ref 0.0–5.0)
Cholesterol, Total: 93 mg/dL — ABNORMAL LOW (ref 100–199)
HDL: 52 mg/dL (ref >39)
LDL Chol Calc (NIH): 21 mg/dL (ref 0–99)
Triglycerides: 109 mg/dL (ref 0–149)
VLDL Cholesterol Cal: 20 mg/dL (ref 5–40)

## 2023-02-01 MED ORDER — TIRZEPATIDE-WEIGHT MANAGEMENT 2.5 MG/0.5ML ~~LOC~~ SOAJ
2.5000 mg | SUBCUTANEOUS | 0 refills | Status: DC
  Filled 2023-02-01 – 2023-02-27 (×2): qty 2, 28d supply, fill #0

## 2023-02-01 NOTE — Progress Notes (Signed)
Cholesterol has improved. Continue  current medication regimen.

## 2023-02-02 ENCOUNTER — Other Ambulatory Visit: Payer: Self-pay

## 2023-02-12 ENCOUNTER — Other Ambulatory Visit: Payer: Self-pay

## 2023-02-12 ENCOUNTER — Ambulatory Visit: Payer: 59 | Attending: Cardiology

## 2023-02-12 DIAGNOSIS — I214 Non-ST elevation (NSTEMI) myocardial infarction: Secondary | ICD-10-CM

## 2023-02-15 DIAGNOSIS — I214 Non-ST elevation (NSTEMI) myocardial infarction: Secondary | ICD-10-CM | POA: Diagnosis not present

## 2023-02-27 ENCOUNTER — Other Ambulatory Visit: Payer: Self-pay

## 2023-03-03 DIAGNOSIS — I214 Non-ST elevation (NSTEMI) myocardial infarction: Secondary | ICD-10-CM | POA: Diagnosis not present

## 2023-03-16 ENCOUNTER — Other Ambulatory Visit: Payer: Self-pay

## 2023-03-16 DIAGNOSIS — M5459 Other low back pain: Secondary | ICD-10-CM | POA: Diagnosis not present

## 2023-03-16 DIAGNOSIS — Z23 Encounter for immunization: Secondary | ICD-10-CM | POA: Diagnosis not present

## 2023-03-16 MED ORDER — PREDNISONE 20 MG PO TABS
ORAL_TABLET | ORAL | 0 refills | Status: DC
  Filled 2023-03-16: qty 31, 14d supply, fill #0

## 2023-03-16 MED ORDER — CYCLOBENZAPRINE HCL 10 MG PO TABS
10.0000 mg | ORAL_TABLET | Freq: Three times a day (TID) | ORAL | 1 refills | Status: DC
  Filled 2023-03-16: qty 90, 30d supply, fill #0

## 2023-03-27 ENCOUNTER — Other Ambulatory Visit: Payer: Self-pay

## 2023-03-29 ENCOUNTER — Other Ambulatory Visit: Payer: Self-pay

## 2023-03-29 MED ORDER — GABAPENTIN 300 MG PO CAPS
300.0000 mg | ORAL_CAPSULE | Freq: Three times a day (TID) | ORAL | 1 refills | Status: DC
  Filled 2023-03-29: qty 90, 30d supply, fill #0

## 2023-04-09 ENCOUNTER — Other Ambulatory Visit: Payer: Self-pay

## 2023-04-10 ENCOUNTER — Other Ambulatory Visit: Payer: Self-pay

## 2023-04-10 MED ORDER — CYCLOBENZAPRINE HCL 10 MG PO TABS
10.0000 mg | ORAL_TABLET | Freq: Three times a day (TID) | ORAL | 1 refills | Status: DC
  Filled 2023-04-10: qty 90, 30d supply, fill #0

## 2023-04-17 ENCOUNTER — Other Ambulatory Visit: Payer: Self-pay

## 2023-04-17 MED ORDER — METOPROLOL SUCCINATE ER 25 MG PO TB24
12.5000 mg | ORAL_TABLET | Freq: Every day | ORAL | 0 refills | Status: DC
  Filled 2023-04-17: qty 15, 30d supply, fill #0
  Filled 2023-04-17: qty 30, 60d supply, fill #0

## 2023-04-24 ENCOUNTER — Other Ambulatory Visit: Payer: Self-pay | Admitting: Internal Medicine

## 2023-04-24 ENCOUNTER — Other Ambulatory Visit: Payer: Self-pay

## 2023-04-25 ENCOUNTER — Other Ambulatory Visit: Payer: Self-pay

## 2023-04-26 ENCOUNTER — Other Ambulatory Visit: Payer: Self-pay

## 2023-04-26 MED FILL — Evolocumab Subcutaneous Soln Auto-Injector 140 MG/ML: SUBCUTANEOUS | 28 days supply | Qty: 2 | Fill #0 | Status: AC

## 2023-04-27 ENCOUNTER — Other Ambulatory Visit: Payer: Self-pay

## 2023-05-01 ENCOUNTER — Ambulatory Visit: Payer: 59 | Attending: Cardiology | Admitting: Cardiology

## 2023-05-01 ENCOUNTER — Encounter: Payer: Self-pay | Admitting: Cardiology

## 2023-05-01 ENCOUNTER — Other Ambulatory Visit: Payer: Self-pay

## 2023-05-01 VITALS — BP 140/90 | HR 84 | Ht 65.0 in | Wt 251.8 lb

## 2023-05-01 DIAGNOSIS — Z79899 Other long term (current) drug therapy: Secondary | ICD-10-CM

## 2023-05-01 DIAGNOSIS — I2510 Atherosclerotic heart disease of native coronary artery without angina pectoris: Secondary | ICD-10-CM | POA: Diagnosis not present

## 2023-05-01 DIAGNOSIS — R002 Palpitations: Secondary | ICD-10-CM

## 2023-05-01 DIAGNOSIS — E782 Mixed hyperlipidemia: Secondary | ICD-10-CM

## 2023-05-01 DIAGNOSIS — R030 Elevated blood-pressure reading, without diagnosis of hypertension: Secondary | ICD-10-CM | POA: Diagnosis not present

## 2023-05-01 MED ORDER — METOPROLOL SUCCINATE ER 25 MG PO TB24
25.0000 mg | ORAL_TABLET | Freq: Every day | ORAL | 3 refills | Status: AC
  Filled 2023-05-01 – 2023-05-25 (×7): qty 90, 90d supply, fill #0
  Filled 2023-08-18: qty 90, 90d supply, fill #1
  Filled 2023-11-18: qty 90, 90d supply, fill #2
  Filled 2024-02-15: qty 90, 90d supply, fill #3

## 2023-05-01 NOTE — Progress Notes (Signed)
Cardiology Office Note:  .   Date:  05/01/2023  ID:  [name], DOB [birth year], MRN [id number] PCP: Lupita Dawn, MD  [facility] Providers Cardiologist:  Yvonne Kendall, MD    History of Present Illness: .   Kevin Clark is a 43 y.o. male with past medical history of hyperlipidemia, obesity, former smoker, coronary artery disease, who is here today for follow-up on his coronary artery disease.   He originally presented to [facility] emergency department complaints of chest pain radiating down his left side before.  He denied any other associated symptoms.  His chest discomfort reoccurred when he presented to the emergency department.  Blood pressure was elevated on arrival at 162/118.  High-sensitivity troponin trended and peaked to 314.  Left heart catheterization revealed single-vessel coronary artery disease with thrombotic 99% stenosis of the inferior branch of the OM1, 50 to 60% mid LAD disease and 20 to 30% stenosis of the mid/distal LMCA, normal left ventricular filling pressure with successful PCI/DES to the inferior branch of OM1.  He was recommended for dual antiplatelet therapy with aspirin and Brilinta for minimum of 12 months and aggressive secondary prevention of his coronary artery disease.  Echocardiogram revealed EF of 55 to 60%, no RWMA, trivial mitral regurgitation.  In follow-up on 09/30/2022 he had been compliant with his medications.  Encouraged to continue with cardiac rehab was started on PCSK9 inhibitor with repeat lipid and hepatic panel ordered.  He was last seen in clinic 10/17/2022, doing well from a cardiac perspective he denies any anginal or concerns for cardiac decompensation.  He had done well with cardiac rehab and had been released and was back to work without incident at that time.   He was last seen in clinic 01/29/2023 and was doing well with the exception of 1 episode back in September where he had some chest tightness and pressure similar to his  recent hospitalization which required the use of Nitrostat x 1 dose.  Denied any recurrent episodes and did not require the use of any of his nitroglycerin.  He was concerned about continued weight gain and was questioning being started on Ozempic.  He was sent for labs.  Prescriptions were sent in for [facility].  He returns to clinic today stating from the cardiac perspective that he has been doing well.  He has recently suffered from a head cold and has not been as active over the past several days.  States that his insurance had denied previously prescribed Ozempic and Zepbound.  Stated that he is currently taking compounded Ozempic that he received from an outside source.  States that he has been compliant with his medications but missed 2 doses of his Brilinta.  Denies any recurrent chest pain or shortness of breath.  Has occasional palpitations but episodes are rare and likely related to caffeine.  Denies any hospitalizations or visits to the emergency department.  ROS: 10 point review of systems has been reviewed and considered negative with exception was been listed in the HPI  Studies Reviewed: Marland Kitchen        LHC 07/26/22 Conclusions: Severe single-vessel coronary artery disease with thrombotic 99% stenosis of inferior branch of OM1.  There is also 50-60% mid LAD disease and 20-30% stenosis of the mid/distal LMCA. Normal left ventricular filling pressure. Successful PCI to inferior branch of OM1 using Onyx Frontier 2.5 x 18 mm drug-eluting stent (postdilated to 2.8 mm) with 0% residual stenosis and TIMI-3 flow.   Recommendations: Dual antiplatelet therapy  with aspirin and ticagrelor for at least 12 months. Aggressive secondary prevention of coronary artery disease. Medical therapy of moderate LAD disease.  If the patient has recurrent chest pain, functional assessment to determine hemodynamic significance of LAD stenosis should be considered.   TTE 07/25/22 1. Left ventricular ejection fraction,  by estimation, is 55 to 60%. The  left ventricle has normal function. The left ventricle has no regional  wall motion abnormalities. Left ventricular diastolic parameters were  normal.   2. Right ventricular systolic function is normal. The right ventricular  size is normal.   3. The mitral valve is normal in structure. Trivial mitral valve  regurgitation. No evidence of mitral stenosis.   4. The aortic valve is tricuspid. Aortic valve regurgitation is not  visualized. No aortic stenosis is present.   5. The inferior vena cava is normal in size with greater than 50%  respiratory variability, suggesting right atrial pressure of 3 mmHg. Risk Assessment/Calculations:     HYPERTENSION CONTROL Vitals:   05/01/23 0805 05/01/23 0820  BP: (!) 144/100 (!) 140/90    The patient's blood pressure is elevated above target today.  In order to address the patient's elevated BP: A current anti-hypertensive medication was adjusted today.          Physical Exam:   VS:  BP (!) 140/90 (BP Location: Left Arm, Patient Position: Sitting, Cuff Size: Large)   Pulse 84   Ht 5\' 5"  (1.651 m)   Wt 251 lb 12.8 oz (114.2 kg)   SpO2 96%   BMI 41.90 kg/m    Wt Readings from Last 3 Encounters:  05/01/23 251 lb 12.8 oz (114.2 kg)  01/29/23 248 lb 3.2 oz (112.6 kg)  10/17/22 239 lb 12.8 oz (108.8 kg)    GEN: Well nourished, well developed in no acute distress NECK: No JVD; No carotid bruits CARDIAC: RRR, no murmurs, rubs, gallops RESPIRATORY:  Clear to auscultation without rales, wheezing or rhonchi  ABDOMEN: Soft, non-tender, non-distended EXTREMITIES:  No edema; No deformity   ASSESSMENT AND PLAN: .   Coronary artery disease of the native coronary artery without angina.  Previously an NSTEMI 07/26/2022 status post PCI/DES to the inferior branch of OM1.  Continued on DAPT for minimum of 12 months of aspirin 81 mg daily Brilinta 90 mg twice daily.  He is continued on Toprol-XL with the dose being increased  today to 25 mg daily and atorvastatin 40 mg daily with Repatha 140 mg injection every 2 weeks.  Continues to remain chest pain-free without any use of his Nitrostat.  No further ischemic testing needed at this time.  Elevated blood pressure without the diagnoses of hypertension with a blood pressure today 144/100 and recheck of 140/90.  Patient has not yet had his metoprolol today.  With increased blood pressures and if trending up and his blood pressure his Toprol-XL was increased from 12.5 mg daily to 25 mg daily.  He has been encouraged to continue to monitor his pressures 1 to 2 hours postmedication administration at home as well.  Mixed hyperlipidemia with an LDL of 21.  He is continued on Repatha 140 mg injection every 2 weeks and atorvastatin 40 mg daily.  On return we will de-escalate atorvastatin dosing.  He remains at goal.  Palpitations on occasion likely related to caffeine.  He has not noticed them with exertion when he is at the gym.  Toprol-XL was increased from 12.5 mg daily to 25 mg daily today.  Morbid obesity with  a BMI of 41.90.  He is currently on compounded Ozempic that he is receiving from an outside prescriber.  He has been scheduled for upcoming labs with CMP and a CBC in 2 months as he had no follow-up after being started on Ozempic.  He is currently on 20 units and is been advised when he started that if he got 40 units with no change in his weight then he can likely stop the medication and it is not being beneficial.  We did discuss the possibility of the when he has changes to his insurance plan to consider adding a weight loss writer so that these medications are covered.       Dispo: Patient return to clinic to see MD/APP in 3 to 4 months or sooner if needed for further evaluation of symptoms.  Signed, Milika Ventress, NP

## 2023-05-01 NOTE — Patient Instructions (Signed)
Medication Instructions:  Your physician recommends the following medication changes.  INCREASE: Toprol - XL 25 mg daily   *If you need a refill on your cardiac medications before your next appointment, please call your pharmacy*   Lab Work: Your provider would like for you to return in 2 months to have the following labs drawn: CMeT and CBC.   Please go to [facility] [address] ([facility] [address], [place] [address] You do not need an appointment.  They are open from 8 am- 4:30 pm.  Lunch from 1:00 pm- 2:00 pm You DO NOT need to be fasting.   You may also go to one of the following [facility]:  [address] [place], [place] [address] Phone: [phone] Lab hours: Mon-Fri 8 am- 5 pm    Lunch 12 pm- 1 pm  [address] [place],  [place]  [address]  [place] Phone: [phone] Lab hours: 7 am- 4 pm Lunch 12 pm-1 pm   [address] [place],  [place]  [address]  [place] Phone: [phone] Lab hours: Mon-Fri 8 am- 5 pm    Lunch 12 pm- 1 pm   Follow-Up: At [facility], you and your health needs are our priority.  As part of our continuing mission to provide you with exceptional heart care, we have created designated Provider Care Teams.  These Care Teams include your primary Cardiologist (physician) and Advanced Practice Providers (APPs -  Physician Assistants and Nurse Practitioners) who all work together to provide you with the care you need, when you need it.   Your next appointment:   3-4 month(s)  Provider:   You may see Yvonne Kendall, MD or one of the following Advanced Practice Providers on your designated Care Team:   Nicolasa Ducking, NP Eula Listen, PA-C Cadence Fransico Michael, PA-C Charlsie Quest, NP Carlos Levering, NP

## 2023-05-03 ENCOUNTER — Other Ambulatory Visit: Payer: Self-pay

## 2023-05-04 ENCOUNTER — Other Ambulatory Visit: Payer: Self-pay

## 2023-05-09 ENCOUNTER — Other Ambulatory Visit: Payer: Self-pay

## 2023-05-11 ENCOUNTER — Other Ambulatory Visit: Payer: Self-pay

## 2023-05-11 MED ORDER — ATORVASTATIN CALCIUM 40 MG PO TABS
40.0000 mg | ORAL_TABLET | Freq: Every day | ORAL | 3 refills | Status: AC
  Filled 2023-05-11: qty 100, 100d supply, fill #0
  Filled 2023-08-03: qty 90, 90d supply, fill #0
  Filled 2023-11-01: qty 90, 90d supply, fill #1
  Filled 2024-02-08: qty 90, 90d supply, fill #2

## 2023-05-20 MED FILL — Evolocumab Subcutaneous Soln Auto-Injector 140 MG/ML: SUBCUTANEOUS | 28 days supply | Qty: 2 | Fill #1 | Status: AC

## 2023-05-25 ENCOUNTER — Other Ambulatory Visit: Payer: Self-pay

## 2023-06-17 MED FILL — Evolocumab Subcutaneous Soln Auto-Injector 140 MG/ML: SUBCUTANEOUS | 28 days supply | Qty: 2 | Fill #2 | Status: AC

## 2023-06-20 ENCOUNTER — Other Ambulatory Visit
Admission: RE | Admit: 2023-06-20 | Discharge: 2023-06-20 | Disposition: A | Discharge status: Home / Self Care | Attending: Cardiology | Admitting: Cardiology

## 2023-06-20 DIAGNOSIS — Z79899 Other long term (current) drug therapy: Secondary | ICD-10-CM | POA: Insufficient documentation

## 2023-06-20 LAB — COMPREHENSIVE METABOLIC PANEL WITH GFR
ALT: 31 U/L (ref 0–44)
AST: 23 U/L (ref 15–41)
Albumin: 4.2 g/dL (ref 3.5–5.0)
Alkaline Phosphatase: 71 U/L (ref 38–126)
Anion gap: 7 (ref 5–15)
BUN: 11 mg/dL (ref 6–20)
CO2: 25 mmol/L (ref 22–32)
Calcium: 9.1 mg/dL (ref 8.9–10.3)
Chloride: 105 mmol/L (ref 98–111)
Creatinine, Ser: 1 mg/dL (ref 0.61–1.24)
GFR, Estimated: >60 mL/min (ref >60)
Glucose, Bld: 94 mg/dL (ref 70–99)
Potassium: 4.5 mmol/L (ref 3.5–5.1)
Sodium: 137 mmol/L (ref 135–145)
Total Bilirubin: 1.1 mg/dL (ref 0.0–1.2)
Total Protein: 7.8 g/dL (ref 6.5–8.1)

## 2023-06-20 LAB — CBC
HCT: 42.1 % (ref 39.0–52.0)
Hemoglobin: 14.3 g/dL (ref 13.0–17.0)
MCH: 30.1 pg (ref 26.0–34.0)
MCHC: 34 g/dL (ref 30.0–36.0)
MCV: 88.6 fL (ref 80.0–100.0)
Platelets: 214 10*3/uL (ref 150–400)
RBC: 4.75 MIL/uL (ref 4.22–5.81)
RDW: 12.7 % (ref 11.5–15.5)
WBC: 7.1 10*3/uL (ref 4.0–10.5)
nRBC: 0 % (ref 0.0–0.2)

## 2023-06-20 NOTE — Progress Notes (Signed)
 All blood work remains stable.  No current changes needed to medication regimen.

## 2023-07-04 ENCOUNTER — Other Ambulatory Visit: Payer: Self-pay

## 2023-07-04 ENCOUNTER — Other Ambulatory Visit: Payer: Self-pay | Admitting: Cardiology

## 2023-07-04 MED ORDER — NITROGLYCERIN 0.4 MG SL SUBL
0.4000 mg | SUBLINGUAL_TABLET | SUBLINGUAL | 4 refills | Status: AC | PRN
  Filled 2023-07-04: qty 25, 8d supply, fill #0
  Filled 2023-11-30: qty 25, 8d supply, fill #1

## 2023-07-17 ENCOUNTER — Other Ambulatory Visit: Payer: Self-pay | Admitting: Internal Medicine

## 2023-07-17 ENCOUNTER — Other Ambulatory Visit: Payer: Self-pay

## 2023-07-17 DIAGNOSIS — I214 Non-ST elevation (NSTEMI) myocardial infarction: Secondary | ICD-10-CM

## 2023-07-17 DIAGNOSIS — E782 Mixed hyperlipidemia: Secondary | ICD-10-CM

## 2023-07-17 DIAGNOSIS — I2510 Atherosclerotic heart disease of native coronary artery without angina pectoris: Secondary | ICD-10-CM

## 2023-07-17 MED ORDER — REPATHA SURECLICK 140 MG/ML ~~LOC~~ SOAJ
140.0000 mg | SUBCUTANEOUS | 3 refills | Status: AC
  Filled 2023-07-17 (×2): qty 6, 84d supply, fill #0
  Filled 2023-10-07: qty 6, 84d supply, fill #1
  Filled 2023-12-28: qty 6, 84d supply, fill #2
  Filled 2024-03-21: qty 6, 84d supply, fill #3

## 2023-07-18 ENCOUNTER — Other Ambulatory Visit (HOSPITAL_COMMUNITY): Payer: Self-pay

## 2023-08-01 ENCOUNTER — Encounter: Payer: Self-pay | Admitting: Cardiology

## 2023-08-01 ENCOUNTER — Ambulatory Visit: Payer: 59 | Attending: Cardiology | Admitting: Cardiology

## 2023-08-01 ENCOUNTER — Ambulatory Visit: Payer: Self-pay | Admitting: Cardiology

## 2023-08-01 VITALS — BP 120/78 | HR 72 | Ht 65.0 in | Wt 243.2 lb

## 2023-08-01 DIAGNOSIS — I2510 Atherosclerotic heart disease of native coronary artery without angina pectoris: Secondary | ICD-10-CM | POA: Diagnosis not present

## 2023-08-01 DIAGNOSIS — R002 Palpitations: Secondary | ICD-10-CM | POA: Diagnosis not present

## 2023-08-01 DIAGNOSIS — E782 Mixed hyperlipidemia: Secondary | ICD-10-CM

## 2023-08-01 NOTE — Telephone Encounter (Signed)
-----   Message from [facility] sent at 08/01/2023  9:39 AM EDT ----- Shelvy Dickens, Will you please let him know that I have talked with Dr End and when he is finished with his current prescription of Brilinta , he can stop taking this medication but recommend he continue on ASA indefinitely.  Thanks,  NIKE

## 2023-08-01 NOTE — Patient Instructions (Signed)
 Medication Instructions:  Your Physician recommend you continue on your current medication as directed.    *If you need a refill on your cardiac medications before your next appointment, please call your pharmacy*  Lab Work: No labs ordered today  If you have labs (blood work) drawn today and your tests are completely normal, you will receive your results only by: MyChart Message (if you have MyChart) OR A paper copy in the mail If you have any lab test that is abnormal or we need to change your treatment, we will call you to review the results.  Testing/Procedures: No test ordered today   Follow-Up: At [facility], you and your health needs are our priority.  As part of our continuing mission to provide you with exceptional heart care, our providers are all part of one team.  This team includes your primary Cardiologist (physician) and Advanced Practice Providers or APPs (Physician Assistants and Nurse Practitioners) who all work together to provide you with the care you need, when you need it.  Your next appointment:   6 month(s)  Provider:   Sammy Crisp, MD or Ronald Cockayne, NP

## 2023-08-01 NOTE — Telephone Encounter (Signed)
 Called patient to advise of change in medication per Ronald Cockayne, NP. Patient verbalized understanding and agreement with plan, no further action

## 2023-08-01 NOTE — Progress Notes (Signed)
 Cardiology Office Note:  .   Date:  08/01/2023  ID:  Kevin Clark, DOB January 05, 1981, MRN [id number] PCP: Alona Jamaica, MD  [facility] Providers Cardiologist:  Sammy Crisp, MD    History of Present Illness: .   Kevin Clark is a 43 y.o. male with a past medical history of hyperlipidemia, obesity, former smoker, coronary artery disease, who is here today for follow-up on his coronary artery disease.   He originally presented to [facility] emergency department complaints of chest pain radiating down his left side before.  He denied any other associated symptoms.  His chest discomfort reoccurred when he presented to the emergency department.  Blood pressure was elevated on arrival at 162/118.  High-sensitivity troponin trended and peaked to 314.  Left heart catheterization revealed single-vessel coronary artery disease with thrombotic 99% stenosis of the inferior branch of the OM1, 50 to 60% mid LAD disease and 20 to 30% stenosis of the mid/distal LMCA, normal left ventricular filling pressure with successful PCI/DES to the inferior branch of OM1.  He was recommended for dual antiplatelet therapy with aspirin  and Brilinta  for minimum of 12 months and aggressive secondary prevention of his coronary artery disease.  Echocardiogram revealed EF of 55 to 60%, no RWMA, trivial mitral regurgitation.  In follow-up on 09/30/2022 he had been compliant with his medications.  Encouraged to continue with cardiac rehab was started on PCSK9 inhibitor with repeat lipid and hepatic panel ordered.  He was last seen in clinic 10/17/2022, doing well from a cardiac perspective he denies any anginal or concerns for cardiac decompensation.  He had done well with cardiac rehab and had been released and was back to work without incident at that time.   He was last seen in clinic 05/01/2023.  At that time he had been doing well from a cardiac perspective.  He recently suffered from COVID in the manage with past several  days.  His insurance denied Ozempic  and Zepbound  and he was currently taking compounded Ozempic  that we received from an outside source.  He had been compliant with his medication but had missed 2 doses of his Brilinta .  Denied any hospitalizations or visits to the emergency department.  There were no further medication changes that were made or testing that was ordered at that time.  He returns to clinic today stating that from the cardiac perspective he has been doing well.  He denies any chest pain, shortness of breath, peripheral edema.  Occasionally has some acid reflux but thinks that is related to diet.  Previously was taking compounded Ozempic  but did not see any weight loss and has stopped it since his last visit he has down 7 pounds.  He is at the year mark where he potentially could stop the Brilinta .  He has not had any undue side effects to any of his current medications.  Denies any hospitalizations or visits to the emergency department.  ROS: 10 point review of systems has been reviewed and considered negative except ones been listed in the HPI  Studies Reviewed: Aaron Aas   EKG Interpretation Date/Time:  Wednesday Aug 01 2023 09:02:36 EDT Ventricular Rate:  72 PR Interval:  148 QRS Duration:  72 QT Interval:  366 QTC Calculation: 400 R Axis:   52  Text Interpretation: Normal sinus rhythm Normal ECG When compared with ECG of 29-Jan-2023 14:39, No significant change was found Confirmed by Ronald Cockayne ([id number]) on 08/01/2023 9:05:49 AM    LHC 07/26/22 Conclusions: Severe single-vessel coronary  artery disease with thrombotic 99% stenosis of inferior branch of OM1.  There is also 50-60% mid LAD disease and 20-30% stenosis of the mid/distal LMCA. Normal left ventricular filling pressure. Successful PCI to inferior branch of OM1 using Onyx Frontier 2.5 x 18 mm drug-eluting stent (postdilated to 2.8 mm) with 0% residual stenosis and TIMI-3 flow.   Recommendations: Dual antiplatelet therapy  with aspirin  and ticagrelor  for at least 12 months. Aggressive secondary prevention of coronary artery disease. Medical therapy of moderate LAD disease.  If the patient has recurrent chest pain, functional assessment to determine hemodynamic significance of LAD stenosis should be considered.   TTE 07/25/22 1. Left ventricular ejection fraction, by estimation, is 55 to 60%. The  left ventricle has normal function. The left ventricle has no regional  wall motion abnormalities. Left ventricular diastolic parameters were  normal.   2. Right ventricular systolic function is normal. The right ventricular  size is normal.   3. The mitral valve is normal in structure. Trivial mitral valve  regurgitation. No evidence of mitral stenosis.   4. The aortic valve is tricuspid. Aortic valve regurgitation is not  visualized. No aortic stenosis is present.   5. The inferior vena cava is normal in size with greater than 50%  respiratory variability, suggesting right atrial pressure of 3 mmHg. Risk Assessment/Calculations:             Physical Exam:   VS:  BP 120/78   Pulse 72   Ht 5\' 5"  (1.651 m)   Wt 243 lb 3.2 oz (110.3 kg)   SpO2 98%   BMI 40.47 kg/m    Wt Readings from Last 3 Encounters:  08/01/23 243 lb 3.2 oz (110.3 kg)  05/01/23 251 lb 12.8 oz (114.2 kg)  01/29/23 248 lb 3.2 oz (112.6 kg)    GEN: Well nourished, well developed in no acute distress NECK: No JVD; No carotid bruits CARDIAC: RRR, no murmurs, rubs, gallops RESPIRATORY:  Clear to auscultation without rales, wheezing or rhonchi  ABDOMEN: Soft, non-tender, non-distended EXTREMITIES:  No edema; No deformity   ASSESSMENT AND PLAN: .   Coronary artery disease native coronary artery without angina.  EKG today reveals sinus rhythm with a rate of 72 with no ischemic changes noted.  He is continued on aspirin  81 mg daily and Brilinta  90 mg daily twice daily as well as Lipitor 40 mg daily Repatha  40 mg every 2 weeks.  Will reach out to  his primary cardiologist about discontinuing Brilinta  at this time as it has been 8 years since his initial event but he did have some residual LAD disease of 50 to 60% was recommended for aggressive secondary prevention and progression.  Mixed hyperlipidemia with an LDL of 21 in 01/2023.  He has remained at goal since starting Repatha .  He has been continued on atorvastatin  40 mg daily and Repatha  140 mg every 2 weeks.  Palpitations that are only on occasion.  Well-controlled on Toprol -XL 25 mg daily.  Morbid obesity with a BMI of 40.47.  Patient is down 7 pounds congratulated on his reduction in weight.  Encouraged continuing with his weight loss journey as he continues to make dietary changes and increase his activity.       Dispo: Patient return to clinic to see MD/APP in 6 months or sooner if needed for reevaluation.  Signed, Shariece Viveiros, NP

## 2023-08-03 ENCOUNTER — Other Ambulatory Visit: Payer: Self-pay

## 2023-09-05 ENCOUNTER — Other Ambulatory Visit: Payer: Self-pay

## 2023-09-06 ENCOUNTER — Other Ambulatory Visit: Payer: Self-pay | Admitting: Cardiology

## 2023-09-06 ENCOUNTER — Other Ambulatory Visit: Payer: Self-pay

## 2023-09-07 ENCOUNTER — Other Ambulatory Visit: Payer: Self-pay

## 2023-09-07 MED FILL — Aspirin Tab Delayed Release 81 MG: ORAL | 30 days supply | Qty: 30 | Fill #0 | Status: AC

## 2023-10-07 MED FILL — Aspirin Tab Delayed Release 81 MG: ORAL | 30 days supply | Qty: 30 | Fill #1 | Status: AC

## 2023-10-08 ENCOUNTER — Other Ambulatory Visit: Payer: Self-pay

## 2023-10-10 DIAGNOSIS — H5213 Myopia, bilateral: Secondary | ICD-10-CM | POA: Diagnosis not present

## 2023-10-10 DIAGNOSIS — H52223 Regular astigmatism, bilateral: Secondary | ICD-10-CM | POA: Diagnosis not present

## 2023-10-10 DIAGNOSIS — H524 Presbyopia: Secondary | ICD-10-CM | POA: Diagnosis not present

## 2023-11-05 ENCOUNTER — Other Ambulatory Visit: Payer: Self-pay

## 2023-11-05 DIAGNOSIS — Z6841 Body Mass Index (BMI) 40.0 and over, adult: Secondary | ICD-10-CM | POA: Diagnosis not present

## 2023-11-05 DIAGNOSIS — E782 Mixed hyperlipidemia: Secondary | ICD-10-CM | POA: Diagnosis not present

## 2023-11-05 DIAGNOSIS — E66812 Obesity, class 2: Secondary | ICD-10-CM | POA: Diagnosis not present

## 2023-11-05 DIAGNOSIS — Z0001 Encounter for general adult medical examination with abnormal findings: Secondary | ICD-10-CM | POA: Diagnosis not present

## 2023-11-05 DIAGNOSIS — I2510 Atherosclerotic heart disease of native coronary artery without angina pectoris: Secondary | ICD-10-CM | POA: Diagnosis not present

## 2023-11-05 DIAGNOSIS — I2101 ST elevation (STEMI) myocardial infarction involving left main coronary artery: Secondary | ICD-10-CM | POA: Diagnosis not present

## 2023-11-05 DIAGNOSIS — Z0000 Encounter for general adult medical examination without abnormal findings: Secondary | ICD-10-CM | POA: Diagnosis not present

## 2023-11-05 MED FILL — Aspirin Tab Delayed Release 81 MG: ORAL | 30 days supply | Qty: 30 | Fill #2 | Status: AC

## 2023-11-05 MED FILL — Aspirin Tab Delayed Release 81 MG: ORAL | 30 days supply | Qty: 30 | Fill #2 | Status: CN

## 2023-11-08 ENCOUNTER — Encounter: Payer: Self-pay | Admitting: *Deleted

## 2023-11-08 ENCOUNTER — Other Ambulatory Visit: Payer: Self-pay

## 2023-11-08 MED ORDER — DOXYCYCLINE HYCLATE 100 MG PO CAPS
100.0000 mg | ORAL_CAPSULE | Freq: Two times a day (BID) | ORAL | 2 refills | Status: DC
  Filled 2023-11-08: qty 60, 30d supply, fill #0
  Filled 2023-12-08: qty 60, 30d supply, fill #1
  Filled 2024-01-07: qty 60, 30d supply, fill #2

## 2023-12-05 ENCOUNTER — Other Ambulatory Visit: Payer: Self-pay

## 2023-12-05 MED FILL — Aspirin Tab Delayed Release 81 MG: ORAL | 30 days supply | Qty: 30 | Fill #3 | Status: AC

## 2024-01-04 MED FILL — Aspirin Tab Delayed Release 81 MG: ORAL | 30 days supply | Qty: 30 | Fill #4 | Status: AC

## 2024-01-28 ENCOUNTER — Ambulatory Visit: Attending: Cardiology | Admitting: Cardiology

## 2024-01-28 ENCOUNTER — Encounter: Payer: Self-pay | Admitting: Cardiology

## 2024-01-28 VITALS — BP 138/88 | HR 69 | Ht 65.0 in | Wt 251.6 lb

## 2024-01-28 DIAGNOSIS — E782 Mixed hyperlipidemia: Secondary | ICD-10-CM | POA: Diagnosis not present

## 2024-01-28 DIAGNOSIS — R002 Palpitations: Secondary | ICD-10-CM

## 2024-01-28 DIAGNOSIS — I2510 Atherosclerotic heart disease of native coronary artery without angina pectoris: Secondary | ICD-10-CM | POA: Diagnosis not present

## 2024-01-28 DIAGNOSIS — G4730 Sleep apnea, unspecified: Secondary | ICD-10-CM

## 2024-01-28 NOTE — Patient Instructions (Signed)
 Medication Instructions:  Your physician recommends that you continue on your current medications as directed. Please refer to the Current Medication list given to you today.   *If you need a refill on your cardiac medications before your next appointment, please call your pharmacy*  Lab Work: No labs ordered today  If you have labs (blood work) drawn today and your tests are completely normal, you will receive your results only by: MyChart Message (if you have MyChart) OR A paper copy in the mail If you have any lab test that is abnormal or we need to change your treatment, we will call you to review the results.  Testing/Procedures: Possible sleep study after vacation  Follow-Up: At [facility], you and your health needs are our priority.  As part of our continuing mission to provide you with exceptional heart care, our providers are all part of one team.  This team includes your primary Cardiologist (physician) and Advanced Practice Providers or APPs (Physician Assistants and Nurse Practitioners) who all work together to provide you with the care you need, when you need it.  Your next appointment:   6 month(s)  Provider:   You may see Lonni Hanson, MD or one of the following Advanced Practice Providers on your designated Care Team:   Tylene Lunch, NP

## 2024-01-28 NOTE — Progress Notes (Signed)
 Cardiology Office Note   Date:  01/28/2024  ID:  Kevin Clark, DOB [birth year], MRN [id number] PCP: Joane Rush, MD  [facility] Providers Cardiologist:  Lonni Hanson, MD     History of Present Illness Kevin Clark is a 43 y.o. male with a past medical history of hyperlipidemia, obesity, former smoker, coronary artery disease, seen today for follow-up.   He originally presented to [facility] emergency department complaints of chest pain radiating down his left side before.  He denied any other associated symptoms.  His chest discomfort reoccurred when he presented to the emergency department.  Blood pressure was elevated on arrival at 162/118.  High-sensitivity troponin trended and peaked to 314.  Left heart catheterization revealed single-vessel coronary artery disease with thrombotic 99% stenosis of the inferior branch of the OM1, 50 to 60% mid LAD disease and 20 to 30% stenosis of the mid/distal LMCA, normal left ventricular filling pressure with successful PCI/DES to the inferior branch of OM1.  He was recommended for dual antiplatelet therapy with aspirin  and Brilinta  for minimum of 12 months and aggressive secondary prevention of his coronary artery disease.  Echocardiogram revealed EF of 55 to 60%, no RWMA, trivial mitral regurgitation.  In follow-up on 09/30/2022 he had been compliant with his medications.  Encouraged to continue with cardiac rehab was started on PCSK9 inhibitor with repeat lipid and hepatic panel ordered.  He was last seen in clinic 10/17/2022, doing well from a cardiac perspective he denies any anginal or concerns for cardiac decompensation.  He had done well with cardiac rehab and had been released and was back to work without incident at that time.  He was seen in clinic 05/01/2023 at that time he been doing well from a cardiac perspective.  He recently suffered from COVID.  Insurance denied his Ozempic  and his send Andrea was currently taking compounded Ozempic   that he received from outside source.  He had been compliant with his current medication regimen.  Denies any hospitalizations or visits to the emergency department.  He was last seen in clinic 08/01/2023 was that he was doing well from a cardiac perspective.  Denies chest pain, shortness breath or peripheral edema.  Occasionally has some acid reflux but thinks it is related to diet.  Previously was taking compounded Ozempic  but did not see any weight loss and has stopped since taking the medication.  There were no medication changes that were made or further testing that was ordered.   He returns to clinic today stating that he has been doing well from a cardiac perspective.  Denies any chest pain, shortness of breath with his exertion, or peripheral edema.  He has continued on compounded Zepbound  with a noted reduction in appetite.  He states that he has continued on his current medication regimen without any undue side effects.  Denies any hospitalization or visits to the emergency department.  ROS: 10 point review of systems has been reviewed and considered negative except ones been listed in the HPI  Studies Reviewed EKG completed today reveals sinus rhythm with rate of 63 with no acute ischemic changes noted.  LHC 07/26/22 Conclusions: Severe single-vessel coronary artery disease with thrombotic 99% stenosis of inferior branch of OM1.  There is also 50-60% mid LAD disease and 20-30% stenosis of the mid/distal LMCA. Normal left ventricular filling pressure. Successful PCI to inferior branch of OM1 using Onyx Frontier 2.5 x 18 mm drug-eluting stent (postdilated to 2.8 mm) with 0% residual stenosis and TIMI-3 flow.  Recommendations: Dual antiplatelet therapy with aspirin  and ticagrelor  for at least 12 months. Aggressive secondary prevention of coronary artery disease. Medical therapy of moderate LAD disease.  If the patient has recurrent chest pain, functional assessment to determine  hemodynamic significance of LAD stenosis should be considered.   TTE 07/25/22 1. Left ventricular ejection fraction, by estimation, is 55 to 60%. The  left ventricle has normal function. The left ventricle has no regional  wall motion abnormalities. Left ventricular diastolic parameters were  normal.   2. Right ventricular systolic function is normal. The right ventricular  size is normal.   3. The mitral valve is normal in structure. Trivial mitral valve  regurgitation. No evidence of mitral stenosis.   4. The aortic valve is tricuspid. Aortic valve regurgitation is not  visualized. No aortic stenosis is present.   5. The inferior vena cava is normal in size with greater than 50%  respiratory variability, suggesting right atrial pressure of 3 mmHg.  Risk Assessment/Calculations    STOP-BANG RISK ASSESSMENT       01/28/2024    9:40 AM  STOP-BANG  Do you snore loudly? Yes  Do you often feel tired, fatigued, or sleepy during the daytime? No  Has anyone observed you stop breathing during sleep? No  Do you have (or are you being treated for) high blood pressure? No  Recent BMI (Calculated) 41.87  Is BMI greater than 35 kg/m2? 1=Yes  Age older than 45 years old? 0=No  Has large neck size > 40 cm (15.7 in, large male shirt size, large male collar size > 16) Yes  Gender - Male 1=Yes  STOP-Bang Total Score 4      If STOP-BANG Score >=3 OR two clinical symptoms - patient qualifies for WatchPAT (CPT 95800)      Sleep study ordered due to two (2) of the following clinical symptoms/diagnoses:  Excessive daytime sleepiness G47.10  Gastroesophageal reflux K21.9  Nocturia R35.1  Morning Headaches G44.221  Difficulty concentrating R41.840  Memory problems or poor judgment G31.84  Personality changes or irritability R45.4  Loud snoring R06.83  Depression F32.9  Unrefreshed by sleep G47.8  Impotence N52.9  History of high blood pressure R03.0  Insomnia G47.00  Sleep Disordered  Breathing or Sleep Apnea ICD G47.33         STOP-Bang Score:  4      Physical Exam VS:  BP 138/88   Pulse 69   Ht 5' 5 (1.651 m)   Wt 251 lb 9.6 oz (114.1 kg)   SpO2 97%   BMI 41.87 kg/m        Wt Readings from Last 3 Encounters:  01/28/24 251 lb 9.6 oz (114.1 kg)  08/01/23 243 lb 3.2 oz (110.3 kg)  05/01/23 251 lb 12.8 oz (114.2 kg)    GEN: Well nourished, well developed in no acute distress NECK: No JVD; No carotid bruits CARDIAC: RRR, no murmurs, rubs, gallops RESPIRATORY:  Clear to auscultation without rales, wheezing or rhonchi  ABDOMEN: Soft, non-tender, non-distended EXTREMITIES:  No edema; No deformity   ASSESSMENT AND PLAN Coronary artery disease of the native coronary arteries without angina.  EKG today reveals sinus rhythm with rate of 63 with no ischemic changes noted.  He is continued on aspirin  81 mg daily, atorvastatin  40 mg daily and Repatha  140 mg every 2 weeks and Toprol -XL 25 mg daily.  EKG today reveals sinus rhythm with rate of 63.  No further ischemic evaluation needed at this time.  Mixed  hyperlipidemia with an LDL of 19.  Continues to remain at goal since restarting Repatha .  He has been continued on atorvastatin  40 mg daily Repatha  140 mg every 2 weeks.  Palpitations that been well-controlled and only are on occasion when he has increased Medicare pain.  He has continued on Toprol -XL 25 mg daily.  Morbid obesity with a BMI of 41.87.  Encouraged to continue with his weight loss journey as he continues to make dietary changes increase activity and he is taking compounded Zepbound .  Sleep altered breathing which she snores on occasion.  We have discussed potentially ordering a WatchPAT to assess for sleep apnea.  He is to discuss this with his wife and will let the office know if he wants to pursue any further testing for this.  STOP-BANG score of 4 qualifies for WatchPAT at this time.       Dispo: Patient to return to clinic to see MD/APP in 6 months or  sooner if needed for further evaluation  Signed, Jasyah Theurer, NP

## 2024-02-08 MED FILL — Aspirin Tab Delayed Release 81 MG: ORAL | 30 days supply | Qty: 30 | Fill #5 | Status: AC

## 2024-02-26 LAB — COLOGUARD: COLOGUARD: NEGATIVE

## 2024-02-28 DIAGNOSIS — G4730 Sleep apnea, unspecified: Secondary | ICD-10-CM

## 2024-02-28 NOTE — Telephone Encounter (Signed)
 We had previously discussed doing a WatchPAT as he has a STOP-BANG score of 4.  If we could go ahead and get that ordered it would be beneficial for him.

## 2024-03-03 ENCOUNTER — Telehealth: Payer: Self-pay

## 2024-03-03 NOTE — Telephone Encounter (Signed)
**Note De-Identified Akansha Wyche Obfuscation** I have sent the pts Itamar-HST order to [facility] so they can do the PA.

## 2024-03-07 MED FILL — Aspirin Tab Delayed Release 81 MG: ORAL | 30 days supply | Qty: 30 | Fill #6 | Status: AC

## 2024-03-14 ENCOUNTER — Encounter (HOSPITAL_BASED_OUTPATIENT_CLINIC_OR_DEPARTMENT_OTHER): Payer: Self-pay | Admitting: Cardiology

## 2024-03-14 DIAGNOSIS — G4733 Obstructive sleep apnea (adult) (pediatric): Secondary | ICD-10-CM | POA: Diagnosis not present

## 2024-03-16 NOTE — Procedures (Signed)
" ° °  SLEEP STUDY REPORT Patient Information Study Date: 03/14/2024 Patient Name: Kevin Clark Patient ID: [id number] Birth Date: [birth year] Age: 44 Gender: Male BMI: 41.9 (W=251 lb, H=5' 5'') Referring Physician: Tylene Lunch, NP  TEST DESCRIPTION: Home sleep apnea testing was completed using the WatchPat, a Type 1 device, utilizing peripheral arterial tonometry (PAT), chest movement, actigraphy, pulse oximetry, pulse rate, body position and snore. AHI was calculated with apnea and hypopnea using valid sleep time as the denominator. RDI includes apneas, hypopneas, and RERAs. The data acquired and the scoring of sleep and all associated events were performed in accordance with the recommended standards and specifications as outlined in the AASM Manual for the Scoring of Sleep and Associated Events 2.2.0 (2015).  FINDINGS:  1. Moderate Obstructive Sleep Apnea with AHI 28.8/hr.  2. No Central Sleep Apnea with pAHIc 0.8/hr.  3. Oxygen desaturations as low as 80%.  4. Moderate snoring was present. O2 sats were < 88% for 6.1 min.  5. Total sleep time was 8 hrs and 30 min.  6. 29.6% of total sleep time was spent in REM sleep.  7. Normal sleep onset latency at 27 min  8. Shortened REM sleep onset latency at 38 min.  9. Total awakenings were 6. 10. Arrhythmia detection: None  DIAGNOSIS: Moderate Obstructive Sleep Apnea (G47.33) Nocturnal Hypoxemia  RECOMMENDATIONS: 1. Clinical correlation of these findings is necessary. The decision to treat obstructive sleep apnea (OSA) is usually based on the presence of apnea symptoms or the presence of associated medical conditions such as Hypertension, Congestive Heart Failure, Atrial Fibrillation or Obesity. The most common symptoms of OSA are snoring, gasping for breath while sleeping, daytime sleepiness and fatigue. 2. Initiating apnea therapy is recommended given the presence of symptoms and/or associated conditions. Recommend proceeding with  one of the following:  a. Auto-CPAP therapy with a pressure range of 5-20cm H2O.  b. An oral appliance (OA) that can be obtained from certain dentists with expertise in sleep medicine. These are primarily of use in non-obese patients with mild and moderate disease.  c. An ENT consultation which may be useful to look for specific causes of obstruction and possible treatment options.  d. If patient is intolerant to PAP therapy, consider referral to ENT for evaluation for hypoglossal nerve stimulator. 3. Close follow-up is necessary to ensure success with CPAP or oral appliance therapy for maximum benefit . 4. A follow-up oximetry study on CPAP is recommended to assess the adequacy of therapy and determine the need for supplemental oxygen or the potential need for Bi-level therapy. An arterial blood gas to determine the adequacy of baseline ventilation and oxygenation should also be considered. 5. Healthy sleep recommendations include: adequate nightly sleep (normal 7-9 hrs/night), avoidance of caffeine after noon and alcohol near bedtime, and maintaining a sleep environment that is cool, dark and quiet. 6. Weight loss for overweight patients is recommended. Even modest amounts of weight loss can significantly improve the severity of sleep apnea. 7. Snoring recommendations include: weight loss where appropriate, side sleeping, and avoidance of alcohol before bed. 8. Operation of motor vehicle should not be performed when sleepy.  Signature: Wilbert Bihari, MD; [facility]; Diplomat, American Board of Sleep Medicine Electronically Signed: 03/16/2024 9:20:22 PM "

## 2024-03-24 ENCOUNTER — Other Ambulatory Visit (HOSPITAL_COMMUNITY): Payer: Self-pay

## 2024-03-24 ENCOUNTER — Telehealth: Payer: Self-pay | Admitting: Pharmacy Technician

## 2024-03-24 ENCOUNTER — Other Ambulatory Visit: Payer: Self-pay

## 2024-03-24 NOTE — Telephone Encounter (Signed)
 Pharmacy Patient Advocate Encounter   Received notification from Patient Advice Request messages that prior authorization for zepbound  is required/requested.   Insurance verification completed.   The patient is insured through [facility].   Per test claim: PA required; PA submitted to above mentioned insurance via Latent Key/confirmation #/[id number] [employer] Status is pending

## 2024-03-24 NOTE — Telephone Encounter (Signed)
 Pharmacy Patient Advocate Encounter  Received notification from [facility] that Prior Authorization for zepbound  has been CANCELLED due to   PA #/Case ID/Reference #: [id number]

## 2024-04-06 ENCOUNTER — Other Ambulatory Visit: Payer: Self-pay

## 2024-04-06 MED FILL — Aspirin Tab Delayed Release 81 MG: ORAL | 30 days supply | Qty: 30 | Fill #7 | Status: AC

## 2024-04-07 ENCOUNTER — Other Ambulatory Visit: Payer: Self-pay

## 2024-04-07 MED ORDER — DOXYCYCLINE HYCLATE 100 MG PO CAPS
100.0000 mg | ORAL_CAPSULE | ORAL | 0 refills | Status: AC
  Filled 2024-04-08: qty 60, 30d supply, fill #0

## 2024-04-08 ENCOUNTER — Other Ambulatory Visit (HOSPITAL_COMMUNITY): Payer: Self-pay

## 2024-04-16 ENCOUNTER — Telehealth: Payer: Self-pay | Admitting: *Deleted

## 2024-04-16 NOTE — Telephone Encounter (Signed)
-----   Message from Wilbert Bihari, MD sent at 03/16/2024  9:22 PM EST ----- Please let patient know that they have sleep apnea.  Recommend therapeutic CPAP titration for treatment of patient's sleep disordered breathing.

## 2024-04-16 NOTE — Telephone Encounter (Signed)
 The patient has been notified of the result and verbalized understanding.  All questions (if any) were answered. Joshua Dalton Seip, CMA 04/16/2024 10:56 AM    Patient says he only had the sleep study to qualify for Zepbound  he does not want a cpap. He will speak to his provider at his next appointment.

## 2024-07-28 ENCOUNTER — Ambulatory Visit: Admitting: Cardiology

## 2024-08-18 ENCOUNTER — Ambulatory Visit: Admitting: Cardiology
